# Patient Record
Sex: Male | Born: 1965 | Race: White | Hispanic: No | Marital: Married | Smoking: Never smoker
Health system: Southern US, Community
[De-identification: ages and names within clinical notes are randomized; demographics above are authoritative.]

## PROBLEM LIST (undated history)

## (undated) DIAGNOSIS — I10 Essential (primary) hypertension: Secondary | ICD-10-CM

## (undated) DIAGNOSIS — E7800 Pure hypercholesterolemia, unspecified: Secondary | ICD-10-CM

## (undated) DIAGNOSIS — N529 Male erectile dysfunction, unspecified: Secondary | ICD-10-CM

## (undated) HISTORY — PX: APPENDECTOMY: SHX54

## (undated) HISTORY — PX: FOOT SURGERY: SHX648

## (undated) HISTORY — PX: ACHILLES TENDON REPAIR: SUR1153

---

## 2019-08-18 ENCOUNTER — Inpatient Hospital Stay (HOSPITAL_COMMUNITY)
Admission: AD | Admit: 2019-08-18 | Discharge: 2019-08-20 | DRG: 101 | Disposition: A | Discharge status: Home / Self Care | Payer: 59 | Attending: Internal Medicine | Admitting: Internal Medicine

## 2019-08-18 ENCOUNTER — Other Ambulatory Visit: Payer: Self-pay

## 2019-08-18 ENCOUNTER — Emergency Department (HOSPITAL_COMMUNITY): Payer: 59

## 2019-08-18 ENCOUNTER — Inpatient Hospital Stay (HOSPITAL_COMMUNITY): Payer: 59

## 2019-08-18 ENCOUNTER — Encounter (HOSPITAL_COMMUNITY): Payer: Self-pay

## 2019-08-18 DIAGNOSIS — I6389 Other cerebral infarction: Secondary | ICD-10-CM

## 2019-08-18 DIAGNOSIS — I639 Cerebral infarction, unspecified: Secondary | ICD-10-CM | POA: Diagnosis present

## 2019-08-18 DIAGNOSIS — Z20822 Contact with and (suspected) exposure to covid-19: Secondary | ICD-10-CM | POA: Diagnosis present

## 2019-08-18 DIAGNOSIS — Z7289 Other problems related to lifestyle: Secondary | ICD-10-CM

## 2019-08-18 DIAGNOSIS — R29701 NIHSS score 1: Secondary | ICD-10-CM | POA: Diagnosis present

## 2019-08-18 DIAGNOSIS — G40209 Localization-related (focal) (partial) symptomatic epilepsy and epileptic syndromes with complex partial seizures, not intractable, without status epilepticus: Secondary | ICD-10-CM | POA: Diagnosis present

## 2019-08-18 DIAGNOSIS — N529 Male erectile dysfunction, unspecified: Secondary | ICD-10-CM | POA: Diagnosis present

## 2019-08-18 DIAGNOSIS — F17210 Nicotine dependence, cigarettes, uncomplicated: Secondary | ICD-10-CM | POA: Diagnosis present

## 2019-08-18 DIAGNOSIS — G43909 Migraine, unspecified, not intractable, without status migrainosus: Secondary | ICD-10-CM | POA: Diagnosis present

## 2019-08-18 DIAGNOSIS — Z79899 Other long term (current) drug therapy: Secondary | ICD-10-CM | POA: Diagnosis not present

## 2019-08-18 DIAGNOSIS — R471 Dysarthria and anarthria: Secondary | ICD-10-CM | POA: Diagnosis present

## 2019-08-18 DIAGNOSIS — K219 Gastro-esophageal reflux disease without esophagitis: Secondary | ICD-10-CM | POA: Diagnosis present

## 2019-08-18 DIAGNOSIS — E7800 Pure hypercholesterolemia, unspecified: Secondary | ICD-10-CM | POA: Diagnosis present

## 2019-08-18 DIAGNOSIS — Z716 Tobacco abuse counseling: Secondary | ICD-10-CM | POA: Diagnosis not present

## 2019-08-18 DIAGNOSIS — I10 Essential (primary) hypertension: Secondary | ICD-10-CM | POA: Diagnosis present

## 2019-08-18 DIAGNOSIS — E538 Deficiency of other specified B group vitamins: Secondary | ICD-10-CM | POA: Diagnosis present

## 2019-08-18 DIAGNOSIS — Z720 Tobacco use: Secondary | ICD-10-CM | POA: Diagnosis not present

## 2019-08-18 DIAGNOSIS — R569 Unspecified convulsions: Secondary | ICD-10-CM | POA: Diagnosis not present

## 2019-08-18 DIAGNOSIS — R2681 Unsteadiness on feet: Secondary | ICD-10-CM

## 2019-08-18 DIAGNOSIS — E785 Hyperlipidemia, unspecified: Secondary | ICD-10-CM | POA: Diagnosis present

## 2019-08-18 DIAGNOSIS — G43809 Other migraine, not intractable, without status migrainosus: Secondary | ICD-10-CM | POA: Diagnosis not present

## 2019-08-18 HISTORY — DX: Male erectile dysfunction, unspecified: N52.9

## 2019-08-18 HISTORY — DX: Essential (primary) hypertension: I10

## 2019-08-18 HISTORY — DX: Pure hypercholesterolemia, unspecified: E78.00

## 2019-08-18 LAB — DIFFERENTIAL
Abs Immature Granulocytes: 0.04 10*3/uL (ref 0.00–0.07)
Basophils Absolute: 0.1 10*3/uL (ref 0.0–0.1)
Basophils Relative: 1 %
Eosinophils Absolute: 0.1 10*3/uL (ref 0.0–0.5)
Eosinophils Relative: 2 %
Immature Granulocytes: 1 %
Lymphocytes Relative: 18 %
Lymphs Abs: 1.2 10*3/uL (ref 0.7–4.0)
Monocytes Absolute: 0.7 10*3/uL (ref 0.1–1.0)
Monocytes Relative: 11 %
Neutro Abs: 4.5 10*3/uL (ref 1.7–7.7)
Neutrophils Relative %: 67 %

## 2019-08-18 LAB — URINALYSIS, ROUTINE W REFLEX MICROSCOPIC
Bilirubin Urine: NEGATIVE
Glucose, UA: NEGATIVE mg/dL
Hgb urine dipstick: NEGATIVE
Ketones, ur: NEGATIVE mg/dL
Leukocytes,Ua: NEGATIVE
Nitrite: NEGATIVE
Protein, ur: NEGATIVE mg/dL
Specific Gravity, Urine: 1.015 (ref 1.005–1.030)
pH: 6 (ref 5.0–8.0)

## 2019-08-18 LAB — RAPID URINE DRUG SCREEN, HOSP PERFORMED
Amphetamines: NOT DETECTED
Barbiturates: NOT DETECTED
Benzodiazepines: NOT DETECTED
Cocaine: NOT DETECTED
Opiates: NOT DETECTED
Tetrahydrocannabinol: NOT DETECTED

## 2019-08-18 LAB — COMPREHENSIVE METABOLIC PANEL
ALT: 25 U/L (ref 0–44)
AST: 19 U/L (ref 15–41)
Albumin: 3.9 g/dL (ref 3.5–5.0)
Alkaline Phosphatase: 102 U/L (ref 38–126)
Anion gap: 9 (ref 5–15)
BUN: 11 mg/dL (ref 6–20)
CO2: 23 mmol/L (ref 22–32)
Calcium: 8.6 mg/dL — ABNORMAL LOW (ref 8.9–10.3)
Chloride: 105 mmol/L (ref 98–111)
Creatinine, Ser: 0.66 mg/dL (ref 0.61–1.24)
GFR calc Af Amer: >60 mL/min (ref >60)
GFR calc non Af Amer: >60 mL/min (ref >60)
Glucose, Bld: 107 mg/dL — ABNORMAL HIGH (ref 70–99)
Potassium: 3.9 mmol/L (ref 3.5–5.1)
Sodium: 137 mmol/L (ref 135–145)
Total Bilirubin: 0.4 mg/dL (ref 0.3–1.2)
Total Protein: 6.8 g/dL (ref 6.5–8.1)

## 2019-08-18 LAB — CBC
HCT: 45.6 % (ref 39.0–52.0)
Hemoglobin: 15.4 g/dL (ref 13.0–17.0)
MCH: 30.6 pg (ref 26.0–34.0)
MCHC: 33.8 g/dL (ref 30.0–36.0)
MCV: 90.5 fL (ref 80.0–100.0)
Platelets: 188 10*3/uL (ref 150–400)
RBC: 5.04 MIL/uL (ref 4.22–5.81)
RDW: 12.8 % (ref 11.5–15.5)
WBC: 6.7 10*3/uL (ref 4.0–10.5)
nRBC: 0 % (ref 0.0–0.2)

## 2019-08-18 LAB — AMMONIA: Ammonia: 36 umol/L — ABNORMAL HIGH (ref 9–35)

## 2019-08-18 LAB — ECHOCARDIOGRAM COMPLETE
Height: 72 in
Weight: 3632 oz

## 2019-08-18 LAB — GLUCOSE, CAPILLARY: Glucose-Capillary: 89 mg/dL (ref 70–99)

## 2019-08-18 LAB — TSH: TSH: 0.604 u[IU]/mL (ref 0.350–4.500)

## 2019-08-18 LAB — PROTIME-INR
INR: 1 (ref 0.8–1.2)
Prothrombin Time: 12.8 seconds (ref 11.4–15.2)

## 2019-08-18 LAB — VITAMIN B12: Vitamin B-12: 153 pg/mL — ABNORMAL LOW (ref 180–914)

## 2019-08-18 LAB — APTT: aPTT: 29 seconds (ref 24–36)

## 2019-08-18 LAB — HIV ANTIBODY (ROUTINE TESTING W REFLEX): HIV Screen 4th Generation wRfx: NONREACTIVE

## 2019-08-18 LAB — CBG MONITORING, ED: Glucose-Capillary: 98 mg/dL (ref 70–99)

## 2019-08-18 LAB — SARS CORONAVIRUS 2 BY RT PCR (HOSPITAL ORDER, PERFORMED IN ~~LOC~~ HOSPITAL LAB): SARS Coronavirus 2: NEGATIVE

## 2019-08-18 IMAGING — CT CT HEAD W/O CM
3 series · 16 of 47 positions shown, 19 images · non-contrast
Comparison: None

CLINICAL DATA: Headache, normal neuro exam

EXAM:
CT HEAD WITHOUT CONTRAST
TECHNIQUE: Contiguous axial images were obtained from the base of the skull
through the vertex without intravenous contrast.

[Series 2: head w o · axial · 0.45mm/px · z∈[+1442,+1577]mm · 10 of 33 slices shown, 13 images]
[im 3/33  brain]
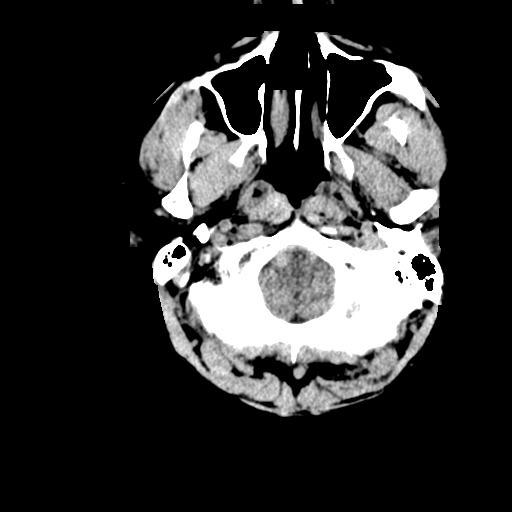
[im 3/33  bone]
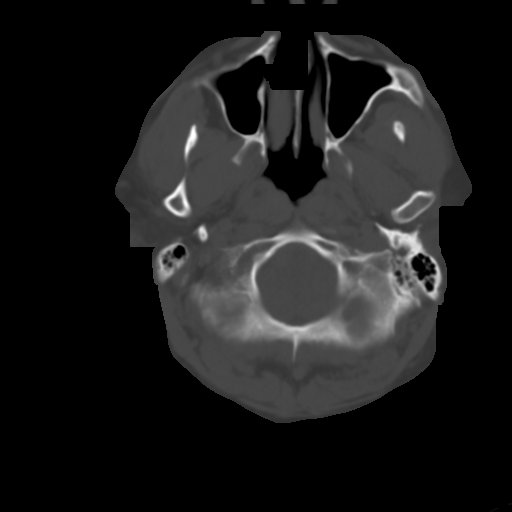
[im 6/33  brain]
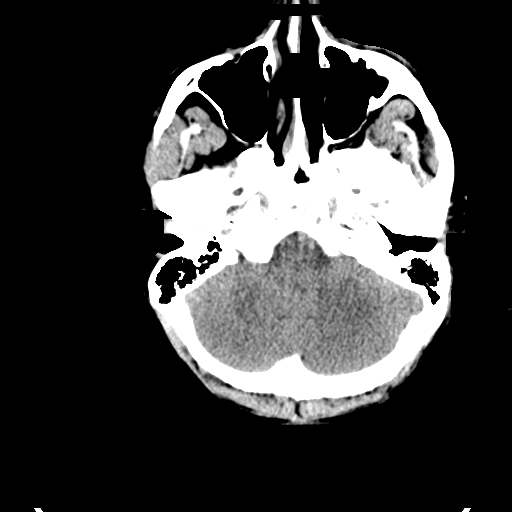
[im 9/33  brain]
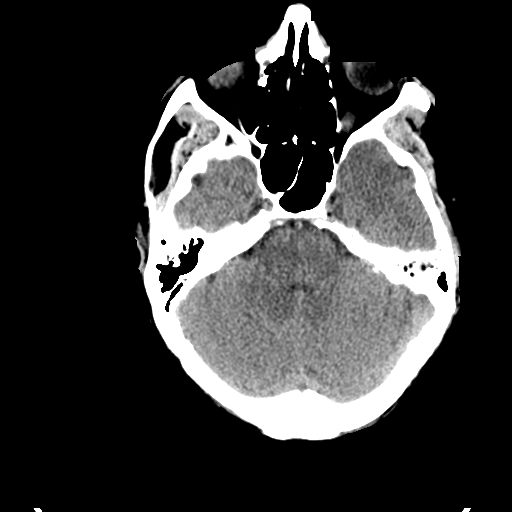
[im 12/33  brain]
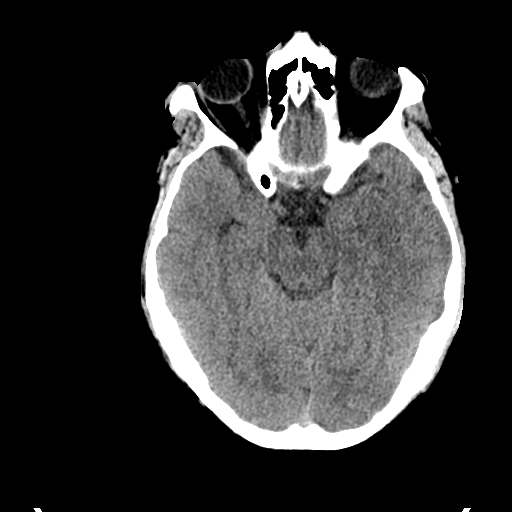
[im 15/33  brain]
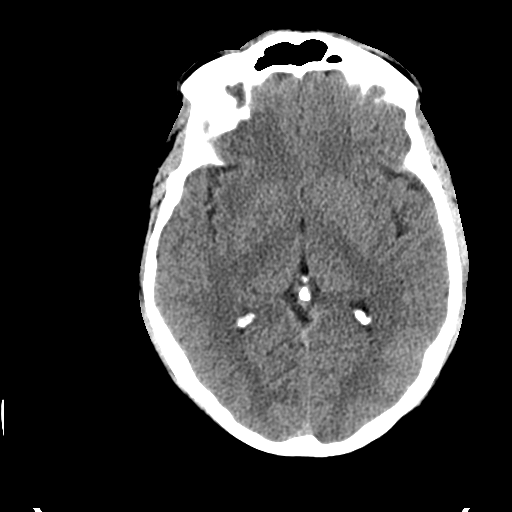
[im 15/33  bone]
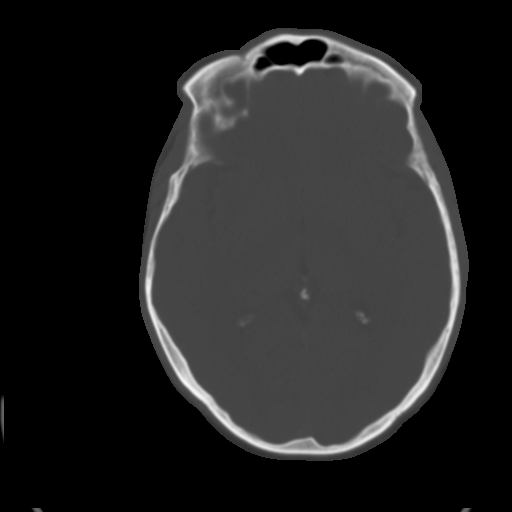
[im 18/33  brain]
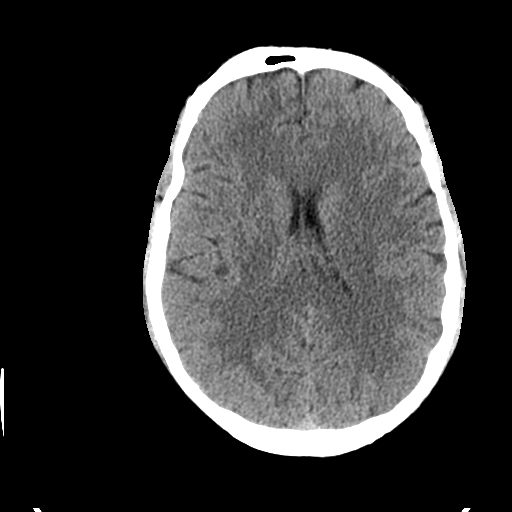
[im 21/33  brain]
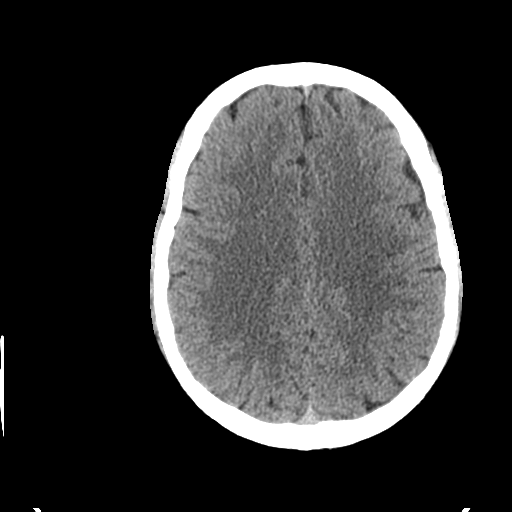
[im 25/33  brain]
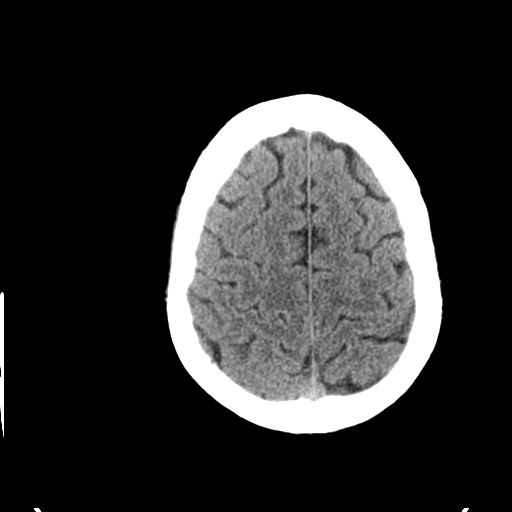
[im 27/33  brain]
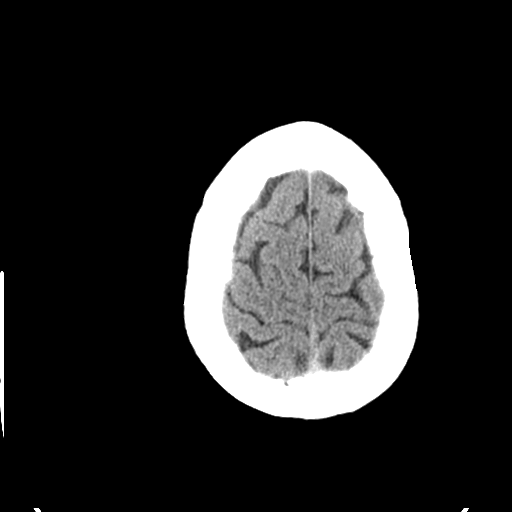
[im 27/33  bone]
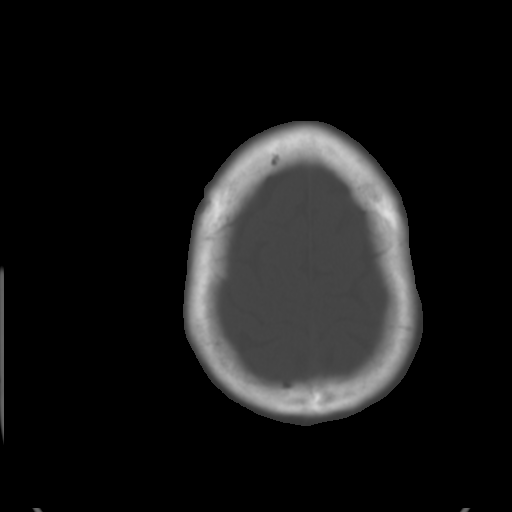
[im 30/33  brain]
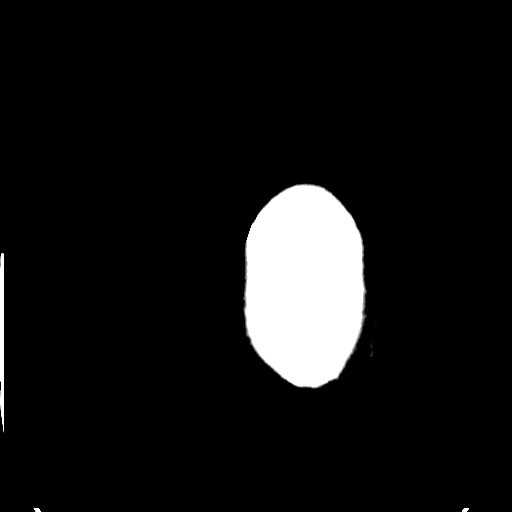

[Series 4: coronal soft · coronal · 0.32mm/px · 3 of 80 slices shown]
[im 27/80  brain]
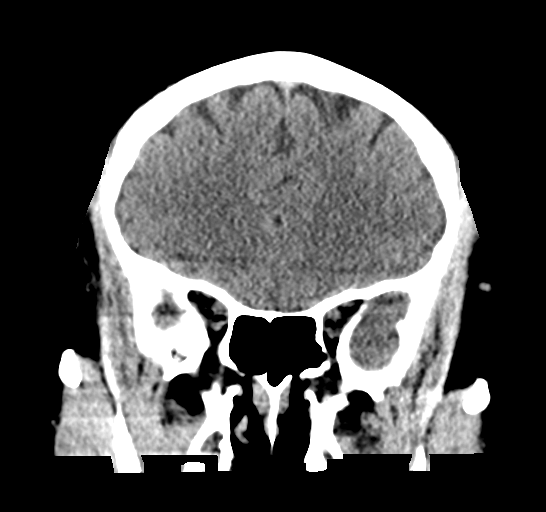
[im 36/80  brain]
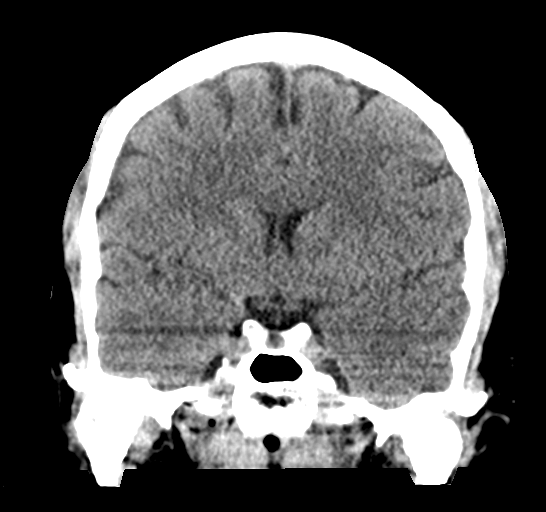
[im 44/80  brain]
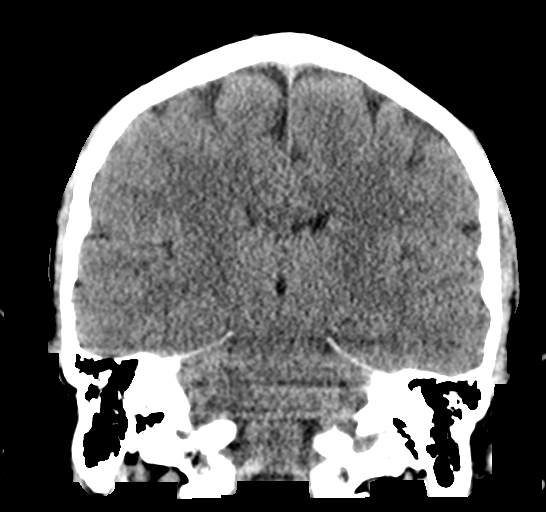

[Series 5: sagittal soft · sagittal · 0.36mm/px · 3 of 57 slices shown]
[im 19/57  brain]
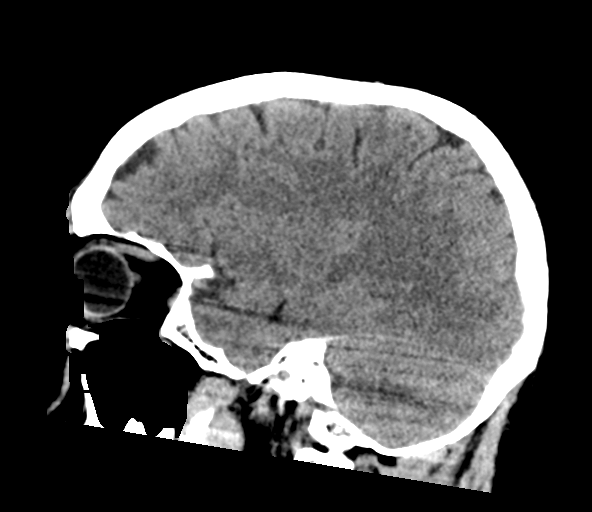
[im 29/57  brain]
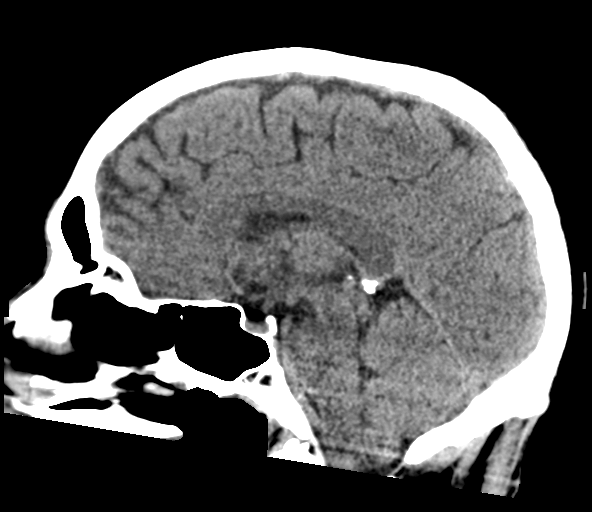
[im 38/57  brain]
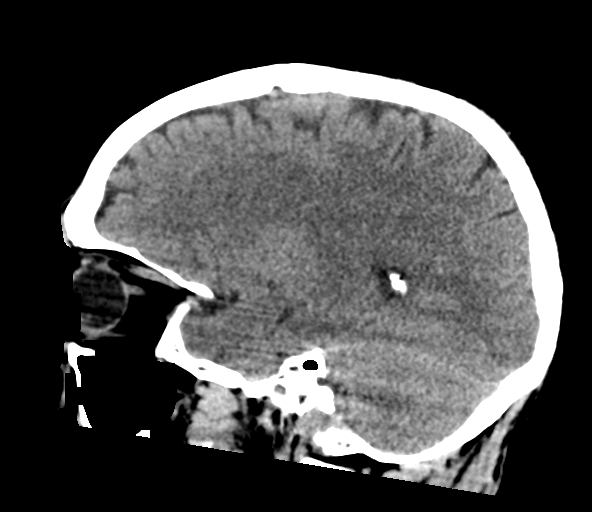

[16 of 47 positions shown; findings below may reference images not displayed]

FINDINGS: Brain: No evidence of acute infarction, hemorrhage, hydrocephalus,
extra-axial collection or mass lesion/mass effect.

Vascular: No hyperdense vessel or unexpected calcification.

Skull: Normal. Negative for fracture or focal lesion.

Sinuses/Orbits: No acute finding.

Other: None.
IMPRESSION: No acute intracranial pathology.

## 2019-08-18 MED ORDER — PANTOPRAZOLE SODIUM 40 MG PO TBEC
40.0000 mg | DELAYED_RELEASE_TABLET | Freq: Every day | ORAL | Status: DC
  Administered 2019-08-19 – 2019-08-20 (×2): 40 mg via ORAL
  Filled 2019-08-18 (×2): qty 1

## 2019-08-18 MED ORDER — ACETAMINOPHEN 160 MG/5ML PO SOLN: 650.0000 mg | ORAL | Status: DC | PRN

## 2019-08-18 MED ORDER — SENNOSIDES-DOCUSATE SODIUM 8.6-50 MG PO TABS
1.0000 | ORAL_TABLET | Freq: Every evening | ORAL | Status: DC | PRN
  Filled 2019-08-18: qty 1

## 2019-08-18 MED ORDER — ASPIRIN 81 MG PO CHEW
81.0000 mg | CHEWABLE_TABLET | Freq: Every day | ORAL | Status: DC
  Administered 2019-08-19 – 2019-08-20 (×2): 81 mg via ORAL
  Filled 2019-08-18 (×2): qty 1

## 2019-08-18 MED ORDER — ACETAMINOPHEN 650 MG RE SUPP: 650.0000 mg | RECTAL | Status: DC | PRN

## 2019-08-18 MED ORDER — NICOTINE 7 MG/24HR TD PT24
7.0000 mg | MEDICATED_PATCH | Freq: Every day | TRANSDERMAL | Status: DC
  Administered 2019-08-18 – 2019-08-20 (×3): 7 mg via TRANSDERMAL
  Filled 2019-08-18 (×5): qty 1

## 2019-08-18 MED ORDER — ACETAMINOPHEN 325 MG PO TABS
650.0000 mg | ORAL_TABLET | ORAL | Status: DC | PRN
  Administered 2019-08-18: 650 mg via ORAL
  Filled 2019-08-18: qty 2

## 2019-08-18 MED ORDER — ASPIRIN 81 MG PO CHEW
324.0000 mg | CHEWABLE_TABLET | Freq: Once | ORAL | Status: AC
  Administered 2019-08-18: 324 mg via ORAL
  Filled 2019-08-18: qty 4

## 2019-08-18 MED ORDER — BUTALBITAL-APAP-CAFFEINE 50-325-40 MG PO TABS
1.0000 | ORAL_TABLET | ORAL | Status: DC | PRN
  Administered 2019-08-18 – 2019-08-19 (×4): 1 via ORAL
  Filled 2019-08-18 (×4): qty 1

## 2019-08-18 MED ORDER — KETOROLAC TROMETHAMINE 30 MG/ML IJ SOLN
15.0000 mg | Freq: Once | INTRAMUSCULAR | Status: AC
  Administered 2019-08-18: 15 mg via INTRAVENOUS
  Filled 2019-08-18: qty 1

## 2019-08-18 MED ORDER — SIMVASTATIN 20 MG PO TABS
40.0000 mg | ORAL_TABLET | Freq: Every day | ORAL | Status: DC
  Administered 2019-08-18 – 2019-08-19 (×2): 40 mg via ORAL
  Filled 2019-08-18 (×2): qty 2

## 2019-08-18 MED ORDER — METOCLOPRAMIDE HCL 5 MG/ML IJ SOLN
10.0000 mg | Freq: Once | INTRAMUSCULAR | Status: DC
  Filled 2019-08-18: qty 2

## 2019-08-18 MED ORDER — LABETALOL HCL 5 MG/ML IV SOLN: 10.0000 mg | INTRAVENOUS | Status: DC | PRN

## 2019-08-18 MED ORDER — ENOXAPARIN SODIUM 40 MG/0.4ML ~~LOC~~ SOLN
40.0000 mg | SUBCUTANEOUS | Status: DC
  Administered 2019-08-18 – 2019-08-19 (×2): 40 mg via SUBCUTANEOUS
  Filled 2019-08-18 (×2): qty 0.4

## 2019-08-18 MED ORDER — SODIUM CHLORIDE 0.9 % IV SOLN: INTRAVENOUS | Status: AC

## 2019-08-18 MED ORDER — CYANOCOBALAMIN 1000 MCG/ML IJ SOLN
1000.0000 ug | Freq: Once | INTRAMUSCULAR | Status: AC
  Administered 2019-08-18: 1000 ug via INTRAMUSCULAR
  Filled 2019-08-18: qty 1

## 2019-08-18 MED ORDER — STROKE: EARLY STAGES OF RECOVERY BOOK
Freq: Once | Status: AC
  Filled 2019-08-18: qty 1

## 2019-08-18 NOTE — Plan of Care (Signed)
Passed [employer] screen

## 2019-08-18 NOTE — Progress Notes (Signed)
*  PRELIMINARY RESULTS* Echocardiogram 2D Echocardiogram has been performed.  Derek Lang 08/18/2019, 1:00 PM

## 2019-08-18 NOTE — Consult Note (Signed)
[facility] Tele[facility]rology Consult Services  Stat Consult  Date of Service:   08/18/2019 08:56:18  Impression:     .  R36.81 - Slurred speech  Comments/Sign-Out: 54 year old man with past medical history of hypertension, hypercholesterolemia, tobacco use and fq etoh use (last use on Sat) who was last known normal on Saturday when he developed a frontal headache extending to the occiput bilaterally. r/o cerebellar/brainstem stroke (lacunar/small vessel disease related). Other causes of slurred speech can include intoxication and metabolic/infectious enceph. Orthostatic hypotension component also plausible.  CT HEAD: Showed No Acute Hemorrhage or Acute Core Infarct Reviewed  Metrics: [facility] Notification Time: 08/18/2019 08:53:12 Stamp Time: 08/18/2019 08:56:18 Callback Response Time: 08/18/2019 08:58:35  Our recommendations are outlined below.  Recommendations:     .  Initiate Aspirin 325 MG Daily     .  asa 325mg  x 1 followed by 81mg  recommended     .  check utox   Imaging Studies:     .  MRI Head Without Contrast     .  MRA Head and Neck Without Contrast When Available - Stroke Protocol     .  Echocardiogram - Transthoracic Echocardiogram  Therapies:     .  Physical Therapy, Occupational Therapy, Speech Therapy Assessment When Applicable  Other WorkUp:     .  Infectious/metabolic workup per primary team     .  Check CMP     .  Check an ammonia level     .  Check B12 level     .  Check TSH     .  Check Urinalysis  Disposition: Neurology Follow Up Recommended  Sign Out:     .  Discussed with Emergency Department Provider  ----------------------------------------------------------------------------------------------------  Chief Complaint: slurred speech  History of Present Illness: Patient is a 54 year old Male.  54 year old man with past medical history of hypertension, hypercholesterolemia, tobacco use and fq etoh use (last use on  Sat) who was last known normal on Saturday when he developed a frontal headache extending to the occiput bilaterally. Since then he has developed slurred speech and dizziness (described as vertigo and lightheadedness) and unsteady gait and intermittent horizontal diplopia. He denies nausea or diff swallowing.    Past Medical History:     . Hypertension     . Hyperlipidemia  Anticoagulant use:  No  Antiplatelet use: No     Examination: BP(126/77), Pulse(59), Blood Glucose(98) 1A: Level of Consciousness - Alert; keenly responsive + 0 1B: Ask Month and Age - Both Questions Right + 0 1C: Blink Eyes & Squeeze Hands - Performs Both Tasks + 0 2: Test Horizontal Extraocular Movements - Normal + 0 3: Test Visual Fields - No Visual Loss + 0 4: Test Facial Palsy (Use Grimace if Obtunded) - Normal symmetry + 0 5A: Test Left Arm Motor Drift - No Drift for 10 Seconds + 0 5B: Test Right Arm Motor Drift - No Drift for 10 Seconds + 0 6A: Test Left Leg Motor Drift - No Drift for 5 Seconds + 0 6B: Test Right Leg Motor Drift - No Drift for 5 Seconds + 0 7: Test Limb Ataxia (FNF/Heel-Shin) - No Ataxia + 0 8: Test Sensation - Normal; No sensory loss + 0 9: Test Language/Aphasia - Normal; No aphasia + 0 10: Test Dysarthria - Mild-Moderate Dysarthria: Slurring but can be understood + 1 11: Test Extinction/Inattention - No abnormality + 0  NIHSS Score: 1   Patient/Family was informed the  Neurology Consult would occur via TeleHealth consult by way of interactive audio and video telecommunications and consented to receiving care in this manner.  Patient is being evaluated for possible acute neurologic impairment and high probability of imminent or life-threatening deterioration. I spent total of 29 minutes providing care to this patient, including time for face to face visit via telemedicine, review of medical records, imaging studies and discussion of findings with providers, the patient and/or  family.   Dr Barron Schmid   [facility] [phone]  Case [id number]

## 2019-08-18 NOTE — ED Triage Notes (Addendum)
EMS reports pt c/o Headache since Saturday.    Denies history of headaches.  Has been taking advil with some relief but has been constant since Saturday.  Last night at 6pm he and his wife noticed he had some slurred speech.  Also says woke up at 2am thinking it was time to get up and was unsteady on his feet.  Went back to bed then got up around 0530 still having slurred speech, headache, and unsteady on his feet.  CBG 93 per ems

## 2019-08-18 NOTE — ED Provider Notes (Signed)
[facility] EMERGENCY DEPARTMENT Provider Note   CSN: [id number] Arrival date & time: 08/18/19  8416     History Chief Complaint  Patient presents with  . Headache    Derek Lang is a 54 y.o. male.  HPI     54 year old with history of hypertension, hyperlipidemia comes in a chief complaint of headache, slurred speech and balance issues.  Patient reports that he started having headache on Saturday.  On Sunday he started noticing that he was having slurred speech and dizziness.  Dizziness is not constant, but present when he gets up and tries to walk.  Symptoms over time improved.  He is also having slurring of the speech without any other focal symptoms such as unilateral numbness, weakness, vision changes.  Patient has no similar history in the past.  His headache is primarily over the vertex and occiput.  The pain has been fairly constant and described as 7 out of 10 at its worst and currently at 2 or 3 out of 10.  Pain does respond to over-the-counter medication but does not completely resolve.  Pain is not positional and there is no history of headaches.  Patient smokes about 1 pack/day.  Past Medical History:  Diagnosis Date  . ED (erectile dysfunction)   . Hypercholesterolemia   . Hypertension     There are no problems to display for this patient.   Past Surgical History:  Procedure Laterality Date  . ACHILLES TENDON REPAIR    . APPENDECTOMY    . FOOT SURGERY         No family history on file.  Social History   Tobacco Use  . Smoking status: Never Smoker  . Smokeless tobacco: Never Used  Substance Use Topics  . Alcohol use: Yes    Comment: occ  . Drug use: Never    Home Medications Prior to Admission medications   Not on File    Allergies    Iodine  Review of Systems   Review of Systems  Constitutional: Positive for activity change.  Eyes: Negative for visual disturbance.  Cardiovascular: Negative for chest pain.  Gastrointestinal: Negative  for nausea and vomiting.  Neurological: Positive for dizziness, speech difficulty and headaches.  Hematological: Does not bruise/bleed easily.  All other systems reviewed and are negative.   Physical Exam Updated Vital Signs BP 126/77   Pulse (!) 59   Temp (!) 97.2 F (36.2 C) (Oral)   Resp 19   Ht 6' (1.829 m)   Wt 103 kg   SpO2 98%   BMI 30.79 kg/m   Physical Exam Vitals reviewed.  Constitutional:      Appearance: He is well-developed.  HENT:     Head: Normocephalic and atraumatic.  Eyes:     General: No visual field deficit.    Pupils: Pupils are equal, round, and reactive to light.  Neck:     Vascular: No JVD.  Cardiovascular:     Rate and Rhythm: Normal rate and regular rhythm.  Pulmonary:     Effort: Pulmonary effort is normal. No respiratory distress.     Breath sounds: Normal breath sounds. No wheezing.  Abdominal:     General: Bowel sounds are normal. There is no distension.     Palpations: Abdomen is soft.     Tenderness: There is no abdominal tenderness. There is no guarding or rebound.  Musculoskeletal:     Cervical back: Normal range of motion and neck supple.  Skin:    General: Skin  is warm and dry.  Neurological:     Mental Status: He is alert and oriented to person, place, and time.     GCS: GCS eye subscore is 4. GCS verbal subscore is 5. GCS motor subscore is 6.     Cranial Nerves: Dysarthria present. No facial asymmetry.     Sensory: No sensory deficit.     Coordination: Coordination normal.     Comments: NIHSS - 0 No objective sensory deficits, Motor strength upper and lower extremity 4+ and equal Normal cerebellar exam     ED Results / Procedures / Treatments   Labs (all labs ordered are listed, but only abnormal results are displayed) Labs Reviewed  PROTIME-INR  APTT  CBC  DIFFERENTIAL  COMPREHENSIVE METABOLIC PANEL  CBG MONITORING, ED  I-STAT CHEM 8, ED    EKG EKG Interpretation  Date/Time:  Monday August 18 2019 07:28:13  EDT Ventricular Rate:  60 PR Interval:    QRS Duration: 87 QT Interval:  439 QTC Calculation: 439 R Axis:   48 Text Interpretation: Sinus rhythm Abnormal R-wave progression, early transition No acute changes No significant change since last tracing Confirmed by Varney Biles [phone]) on 08/18/2019 7:43:06 AM   Radiology CT HEAD WO CONTRAST  Result Date: 08/18/2019 CLINICAL DATA:  Headache, normal neuro exam EXAM: CT HEAD WITHOUT CONTRAST TECHNIQUE: Contiguous axial images were obtained from the base of the skull through the vertex without intravenous contrast. COMPARISON:  None FINDINGS: Brain: No evidence of acute infarction, hemorrhage, hydrocephalus, extra-axial collection or mass lesion/mass effect. Vascular: No hyperdense vessel or unexpected calcification. Skull: Normal. Negative for fracture or focal lesion. Sinuses/Orbits: No acute finding. Other: None. IMPRESSION: No acute intracranial pathology. Electronically Signed   By: Zetta Bills M.D.   On: 08/18/2019 08:09    Procedures Procedures (including critical care time)  Medications Ordered in ED Medications - No data to display  ED Course  I have reviewed the triage vital signs and the nursing notes.  Pertinent labs & imaging results that were available during my care of the patient were reviewed by me and considered in my medical decision making (see chart for details).    MDM Rules/Calculators/A&P                          54 year old comes in a chief complaint of headache, slurred speech and dizziness. He has history of hypertension, hyperlipidemia and smokes 1 pack/day.  Initial stroke scale is 1, for dysarthria. Clinical concerns are for subacute stroke.  Brain aneurysm, brain bleed, thrombosis also in the differential given the headache.  CT head does not reveal any acute findings.  We will consult neurology.  Final Clinical Impression(s) / ED Diagnoses Final diagnoses:  None    Rx / DC Orders ED Discharge  Orders    None       Varney Biles, MD 08/18/19 [phone]

## 2019-08-18 NOTE — Progress Notes (Signed)
Patients family member yelled into the hallway stating help we need help in here. Nurse went to patients room. Upon entering patients room. Patient was lying on right side. Patient had rapid eye movement. Patient slurring speech. Vitals obtained B/P 138/95 pulse 63 O2 98 on room air. Lisa RN obtained CBG reading was 89. Misty Stanley RN and Nurse preformed NIH Assessment patient scored a 1 due to slurred speech. Nurse asked patient does he remember his eyes moving rapidly and patient stated yes he was uncomfortable and turned to his right side. Dr. Sherryll Burger notified. Patient also stated he had a headache rating 5/10. Dr Sherryll Burger placed order for fioricet PRN refer to [facility] for correct dose. Will continue to monitor patient throughout shift.

## 2019-08-18 NOTE — Consult Note (Addendum)
[facility] Derek Lang A. Gerilyn Pilgrim, MD     [email or web address]          Derek Lang is an 54 y.o. male.   ASSESSMENT/PLAN: 1.  Patient has multiple symptoms of unclear etiology.  The symptoms includes dizziness, headaches and dysarthria.  The findings are nonspecific and could represent acute ischemic stroke not seen on CT scan.  Additionally, given his age multiple sclerosis is also concerning.  The patient did have 1 episodic spell of vertigo associated with nystagmus which is concerning for seizures.  I agree with the initial work-up started.  An EEG will be obtained.  Patient has been placed on aspirin.  Patient seems to be all under increased psychosocial stressors related to his father who died about 4 weeks ago and his mother who is ill.  This undoubtedly is also contributing to the patient's symptoms. 2.  Vitamin B12 deficiency which will be replaced.     The patient is a 54 year old white male who presents with symptoms over the last 3 days.  Saturday night he developed low-grade headaches in the posterior area.  It started out as a 2/10 headache and gradually increase overnight 12/10.  He reports that the following day he also developed blurred vision and slurring of his speech.  The symptoms have persisted without any improvement and hence he decided seek medical attention.  It appears that he has significant dizziness and blurring of his vision especially on standing.  The patient had an episode in the hospital earlier today where he developed significant vertiginous symptoms associated with the nurse witnessing the patient having nystagmus.  The patient also had diplopia which resolved.  The episode lasted for couple of minutes or so.  The symptoms are all new over the last 3 days.  There are no reports of fevers, focal numbness or weakness or difficulty swallowing.  He does report having some balance problems associated with his dizziness.  There are no reports of dyspnea,  palpitation, or chest pain.  The patient does not have history of routine episodic headaches.  The review of systems otherwise negative.   GENERAL: The patient is currently doing decently.  He is in no acute distress.  HEENT: Neck is supple no trauma noted.  The patient does have a large tongue and crowded reddened airway.  ABDOMEN: soft  EXTREMITIES: No edema   BACK: Normal  SKIN: Normal by inspection.    MENTAL STATUS: Alert and oriented. Speech -speech shows stuttering, language and cognition are generally intact. Judgment and insight normal.   CRANIAL NERVES: Pupils are equal, round and reactive to light and accomodation; extra ocular movements are full, there is no significant nystagmus; visual fields are full; upper and lower facial muscles are normal in strength and symmetric, there is no flattening of the nasolabial folds; tongue is midline; uvula is midline; shoulder elevation is normal.  MOTOR: Normal tone, bulk and strength; no pronator drift.  COORDINATION: Left finger to nose is normal, right finger to nose is normal, No rest tremor; no intention tremor; no postural tremor; no bradykinesia.  REFLEXES: Deep tendon reflexes are symmetrical and normal. Plantar reflexes are equivocal bilaterally.   SENSATION: Normal to light touch, temperature, and pain.      Blood pressure (!) 138/95, pulse 63, temperature 98.2 F (36.8 C), temperature source Oral, resp. rate 20, height 6' (1.829 m), weight 103 kg, SpO2 98 %.  Past Medical History:  Diagnosis Date  . ED (erectile dysfunction)   . Hypercholesterolemia   .  Hypertension     Past Surgical History:  Procedure Laterality Date  . ACHILLES TENDON REPAIR    . APPENDECTOMY    . FOOT SURGERY      No family history on file.  Social History:  reports that he has never smoked. He has never used smokeless tobacco. He reports current alcohol use. He reports that he does not use drugs.  Allergies:  Allergies  Allergen  Reactions  . Iodine   . Tape     Medications: Prior to Admission medications   Medication Sig Start Date End Date Taking? Authorizing Provider  Cholecalciferol 125 MCG (5000 UT) capsule Take 1 capsule by mouth daily.   Yes [provider]  losartan (COZAAR) 100 MG tablet Take 1 tablet by mouth daily. 05/02/18  Yes [provider]  omeprazole (PRILOSEC) 20 MG capsule Take 20 mg by mouth daily.   Yes [provider]  simvastatin (ZOCOR) 40 MG tablet Take 1 tablet by mouth daily. 05/02/18  Yes [provider]  tadalafil (CIALIS) 20 MG tablet Take 10 mg by mouth daily as needed. 08/04/19  Yes [provider]    Scheduled Meds: . [START ON 08/19/2019] aspirin  81 mg Oral Daily  . enoxaparin (LOVENOX) injection  40 mg Subcutaneous Q24H  . nicotine  7 mg Transdermal Daily  . pantoprazole  40 mg Oral Daily  . simvastatin  40 mg Oral Daily   Continuous Infusions: . sodium chloride 75 mL/hr at 08/18/19 1218   PRN Meds:.acetaminophen **OR** acetaminophen (TYLENOL) oral liquid 160 mg/5 mL **OR** acetaminophen, butalbital-acetaminophen-caffeine, labetalol, senna-docusate     Results for orders placed or performed during the hospital encounter of 08/18/19 (from the past 48 hour(s))  Protime-INR     Status: None   Collection Time: 08/18/19  7:47 AM  Result Value Ref Range   Prothrombin Time 12.8 11.4 - 15.2 seconds   INR 1.0 0.8 - 1.2    Comment: (NOTE) INR goal varies based on device and disease states. Performed at [facility], [address], [place], [place] [address]   APTT     Status: None   Collection Time: 08/18/19  7:47 AM  Result Value Ref Range   aPTT 29 24 - 36 seconds    Comment: Performed at [facility], [address] [place], [place] [address]  CBC     Status: None   Collection Time: 08/18/19  7:47 AM  Result Value Ref Range   WBC 6.7 4.0 - 10.5 K/uL   RBC 5.04 4.22 - 5.81 MIL/uL   Hemoglobin 15.4 13.0 - 17.0 g/dL   HCT  23.5 39 - 52 %   MCV 90.5 80.0 - 100.0 fL   MCH 30.6 26.0 - 34.0 pg   MCHC 33.8 30.0 - 36.0 g/dL   RDW 57.3 22.0 - 25.4 %   Platelets 188 150 - 400 K/uL   nRBC 0.0 0.0 - 0.2 %    Comment: Performed at [facility], [address], [place], [place] [address]  Differential     Status: None   Collection Time: 08/18/19  7:47 AM  Result Value Ref Range   Neutrophils Relative % 67 %   Neutro Abs 4.5 1.7 - 7.7 K/uL   Lymphocytes Relative 18 %   Lymphs Abs 1.2 0.7 - 4.0 K/uL   Monocytes Relative 11 %   Monocytes Absolute 0.7 0 - 1 K/uL   Eosinophils Relative 2 %   Eosinophils Absolute 0.1 0 - 0 K/uL  Basophils Relative 1 %   Basophils Absolute 0.1 0 - 0 K/uL   Immature Granulocytes 1 %   Abs Immature Granulocytes 0.04 0.00 - 0.07 K/uL    Comment: Performed at [facility], [address] [place], [place] [address]  Comprehensive metabolic panel     Status: Abnormal   Collection Time: 08/18/19  7:47 AM  Result Value Ref Range   Sodium 137 135 - 145 mmol/L   Potassium 3.9 3.5 - 5.1 mmol/L   Chloride 105 98 - 111 mmol/L   CO2 23 22 - 32 mmol/L   Glucose, Bld 107 (H) 70 - 99 mg/dL    Comment: Glucose reference range applies only to samples taken after fasting for at least 8 hours.   BUN 11 6 - 20 mg/dL   Creatinine, Ser 6.040.66 0.61 - 1.24 mg/dL   Calcium 8.6 (L) 8.9 - 10.3 mg/dL   Total Protein 6.8 6.5 - 8.1 g/dL   Albumin 3.9 3.5 - 5.0 g/dL   AST 19 15 - 41 U/L   ALT 25 0 - 44 U/L   Alkaline Phosphatase 102 38 - 126 U/L   Total Bilirubin 0.4 0.3 - 1.2 mg/dL   GFR calc non Af Amer >60 >60 mL/min   GFR calc Af Amer >60 >60 mL/min   Anion gap 9 5 - 15    Comment: Performed at [facility], [address] [place], [place] [address]  HIV Antibody (routine testing w rflx)     Status: None   Collection Time: 08/18/19  7:47 AM  Result Value Ref Range   HIV Screen 4th Generation wRfx Non Reactive Non Reactive    Comment: Performed at [facility], [address] [place],  [place] [address]  CBG monitoring, ED     Status: None   Collection Time: 08/18/19  7:59 AM  Result Value Ref Range   Glucose-Capillary 98 70 - 99 mg/dL    Comment: Glucose reference range applies only to samples taken after fasting for at least 8 hours.  Urinalysis, Routine w reflex microscopic     Status: None   Collection Time: 08/18/19 10:44 AM  Result Value Ref Range   Color, Urine YELLOW YELLOW   APPearance CLEAR CLEAR   Specific Gravity, Urine 1.015 1.005 - 1.030   pH 6.0 5.0 - 8.0   Glucose, UA NEGATIVE NEGATIVE mg/dL   Hgb urine dipstick NEGATIVE NEGATIVE   Bilirubin Urine NEGATIVE NEGATIVE   Ketones, ur NEGATIVE NEGATIVE mg/dL   Protein, ur NEGATIVE NEGATIVE mg/dL   Nitrite NEGATIVE NEGATIVE   Leukocytes,Ua NEGATIVE NEGATIVE    Comment: Performed at [facility], [address] [place], [place] [address]  Urine rapid drug screen (hosp performed)     Status: None   Collection Time: 08/18/19 10:45 AM  Result Value Ref Range   Opiates NONE DETECTED NONE DETECTED   Cocaine NONE DETECTED NONE DETECTED   Benzodiazepines NONE DETECTED NONE DETECTED   Amphetamines NONE DETECTED NONE DETECTED   Tetrahydrocannabinol NONE DETECTED NONE DETECTED   Barbiturates NONE DETECTED NONE DETECTED    Comment: (NOTE) DRUG SCREEN FOR MEDICAL PURPOSES ONLY.  IF CONFIRMATION IS NEEDED FOR ANY PURPOSE, NOTIFY LAB WITHIN 5 DAYS.  LOWEST DETECTABLE LIMITS FOR URINE DRUG SCREEN Drug Class                     Cutoff (ng/mL) Amphetamine and metabolites    1000 Barbiturate and metabolites    200 Benzodiazepine  831 Tricyclics and metabolites     300 Opiates and metabolites        300 Cocaine and metabolites        300 THC                            50 Performed at [facility], [address] [place], [place] [address]   Vitamin B12     Status: Abnormal   Collection Time: 08/18/19 10:47 AM  Result Value Ref Range   Vitamin B-12 153 (L) 180 - 914 pg/mL    Comment: (NOTE) This assay  is not validated for testing neonatal or myeloproliferative syndrome specimens for Vitamin B12 levels. Performed at [facility], [address] [place], [place] [address]   TSH     Status: None   Collection Time: 08/18/19 10:47 AM  Result Value Ref Range   TSH 0.604 0.350 - 4.500 uIU/mL    Comment: Performed by a 3rd Generation assay with a functional sensitivity of <=0.01 uIU/mL. Performed at [facility], [address], [place], [place] [address]   Ammonia     Status: Abnormal   Collection Time: 08/18/19 10:47 AM  Result Value Ref Range   Ammonia 36 (H) 9 - 35 umol/L    Comment: Performed at [facility], [address] [place], [place] [address]  SARS Coronavirus 2 by RT PCR (hospital order, performed in [facility] hospital lab) Nasopharyngeal Nasopharyngeal Swab     Status: None   Collection Time: 08/18/19 11:18 AM   Specimen: Nasopharyngeal Swab  Result Value Ref Range   SARS Coronavirus 2 NEGATIVE NEGATIVE    Comment: (NOTE) SARS-CoV-2 target nucleic acids are NOT DETECTED.  The SARS-CoV-2 RNA is generally detectable in upper and lower respiratory specimens during the acute phase of infection. The lowest concentration of SARS-CoV-2 viral copies this assay can detect is 250 copies / mL. A negative result does not preclude SARS-CoV-2 infection and should not be used as the sole basis for treatment or other patient management decisions.  A negative result may occur with improper specimen collection / handling, submission of specimen other than nasopharyngeal swab, presence of viral mutation(s) within the areas targeted by this assay, and inadequate number of viral copies (<250 copies / mL). A negative result must be combined with clinical observations, patient history, and epidemiological information.  Fact Sheet for Patients:   StrictlyIdeas.no  Fact Sheet for Healthcare Providers: BankingDealers.co.za  This test is not yet  approved or  cleared by the [place] FDA and has been authorized for detection and/or diagnosis of SARS-CoV-2 by FDA under an Emergency Use Authorization (EUA).  This EUA will remain in effect (meaning this test can be used) for the duration of the COVID-19 declaration under Section 564(b)(1) of the Act, 21 U.S.C. section 360bbb-3(b)(1), unless the authorization is terminated or revoked sooner.  Performed at [facility], [address] [place], [place] [address]   Glucose, capillary     Status: None   Collection Time: 08/18/19  4:20 PM  Result Value Ref Range   Glucose-Capillary 89 70 - 99 mg/dL    Comment: Glucose reference range applies only to samples taken after fasting for at least 8 hours.    Studies/Results:   HEAD CT FINDINGS: Brain: No evidence of acute infarction, hemorrhage, hydrocephalus, extra-axial collection or mass lesion/mass effect.  Vascular: No hyperdense vessel or unexpected calcification.  Skull: Normal. Negative for fracture or focal lesion.  Sinuses/Orbits: No acute finding.  Other: None.  IMPRESSION: No acute intracranial pathology    The head CT scan is reviewed in person in shows no acute changes. There is no evidence of hypodensity, encephalomalacia or fluid collection. There is calcified choroid plexus.     Evrett Hakim A. Gerilyn Pilgrim, M.D.  Diplomate, Biomedical engineer of Psychiatry and Neurology ( Neurology). 08/18/2019, 5:19 PM

## 2019-08-18 NOTE — H&P (Addendum)
History and Physical    Derek Lang YBO:[id number] DOB: 1965/04/25 DOA: 08/18/2019  PCP: Florene Glen, NP   Patient coming from: Home  Chief Complaint: Headache/speech difficulty  HPI: Derek Lang is a 54 y.o. male with medical history significant for hypertension, dyslipidemia, occasional alcohol use, and ongoing tobacco abuse who presented today with complaints of headache that began 2 days ago on Saturday.  He states that the headache was quite severe and started on the frontal side of his head and radiated towards the back.  He was also noted to have some speech slurring at that time which then continued to worsen into Sunday and was also accompanied with double vision as well as some dizziness and gait difficulties.  He states that he has some worsening dizziness when he lays down and tends to improve with standing up.  His symptoms worsened today which prompted the ED visit.  He denies any weakness or numbness in his arms or legs.  He rates the pain level currently 3/10.  He has tried some over-the-counter ibuprofen and Tylenol with minimal assistance with his pain.  He denies any fever, chills, nausea, vomiting, chest pain, shortness of breath, or abdominal pain.   ED Course: Stable vital signs noted and laboratory data without any acute abnormalities.  CT of the head with no acute findings noted either.  EKG was sinus rhythm at 60 bpm.  He is being given Toradol for his headache.  Telemetry neurology evaluation performed with recommendations for MRI head and MRA head and neck without contrast as well as 2D echocardiogram and neurology evaluation.  Multiple lab recommendations ordered as well and pending.  Review of Systems: All others reviewed and otherwise negative as noted above.   Past Medical History:  Diagnosis Date  . ED (erectile dysfunction)   . Hypercholesterolemia   . Hypertension     Past Surgical History:  Procedure Laterality Date  . ACHILLES TENDON REPAIR    .  APPENDECTOMY    . FOOT SURGERY       reports that he has never smoked. He has never used smokeless tobacco. He reports current alcohol use. He reports that he does not use drugs.  Allergies  Allergen Reactions  . Iodine   . Tape     No family history on file.  Prior to Admission medications   Not on File    Physical Exam: Vitals:   08/18/19 0724 08/18/19 0800 08/18/19 0900 08/18/19 1100  BP: 133/82 126/77 118/72 130/78  Pulse: (!) 59 (!) 59 (!) 56 62  Resp: 16 19 15 16   Temp: (!) 97.2 F (36.2 C)     TempSrc: Oral     SpO2: 100% 98% 100% 99%  Weight:      Height:        Constitutional: NAD, calm, comfortable Vitals:   08/18/19 0724 08/18/19 0800 08/18/19 0900 08/18/19 1100  BP: 133/82 126/77 118/72 130/78  Pulse: (!) 59 (!) 59 (!) 56 62  Resp: 16 19 15 16   Temp: (!) 97.2 F (36.2 C)     TempSrc: Oral     SpO2: 100% 98% 100% 99%  Weight:      Height:       Eyes: lids and conjunctivae normal ENMT: Mucous membranes are moist.  Neck: normal, supple Respiratory: clear to auscultation bilaterally. Normal respiratory effort. No accessory muscle use.  Cardiovascular: Regular rate and rhythm, no murmurs. No extremity edema. Abdomen: no tenderness, no distention. Bowel sounds positive.  Musculoskeletal:  No joint deformity upper and lower extremities.   Skin: no rashes, lesions, ulcers.  Psychiatric: Normal judgment and insight. Alert and oriented x 3. Normal mood.  Neurology: Cranial nerve examination grossly normal and no focal deficits identified on upper or lower extremity exam.  Labs on Admission: I have personally reviewed following labs and imaging studies  CBC: Recent Labs  Lab 08/18/19 0747  WBC 6.7  NEUTROABS 4.5  HGB 15.4  HCT 45.6  MCV 90.5  PLT 188   Basic Metabolic Panel: Recent Labs  Lab 08/18/19 0747  NA 137  K 3.9  CL 105  CO2 23  GLUCOSE 107*  BUN 11  CREATININE 0.66  CALCIUM 8.6*   GFR: Estimated Creatinine Clearance: 134.1  mL/min (by C-G formula based on SCr of 0.66 mg/dL). Liver Function Tests: Recent Labs  Lab 08/18/19 0747  AST 19  ALT 25  ALKPHOS 102  BILITOT 0.4  PROT 6.8  ALBUMIN 3.9   No results for input(s): LIPASE, AMYLASE in the last 168 hours. No results for input(s): AMMONIA in the last 168 hours. Coagulation Profile: Recent Labs  Lab 08/18/19 0747  INR 1.0   Cardiac Enzymes: No results for input(s): CKTOTAL, CKMB, CKMBINDEX, TROPONINI in the last 168 hours. BNP (last 3 results) No results for input(s): PROBNP in the last 8760 hours. HbA1C: No results for input(s): HGBA1C in the last 72 hours. CBG: Recent Labs  Lab 08/18/19 0759  GLUCAP 98   Lipid Profile: No results for input(s): CHOL, HDL, LDLCALC, TRIG, CHOLHDL, LDLDIRECT in the last 72 hours. Thyroid Function Tests: No results for input(s): TSH, T4TOTAL, FREET4, T3FREE, THYROIDAB in the last 72 hours. Anemia Panel: No results for input(s): VITAMINB12, FOLATE, FERRITIN, TIBC, IRON, RETICCTPCT in the last 72 hours. Urine analysis:    Component Value Date/Time   COLORURINE YELLOW 08/18/2019 1044   APPEARANCEUR CLEAR 08/18/2019 1044   LABSPEC 1.015 08/18/2019 1044   PHURINE 6.0 08/18/2019 1044   GLUCOSEU NEGATIVE 08/18/2019 1044   HGBUR NEGATIVE 08/18/2019 1044   BILIRUBINUR NEGATIVE 08/18/2019 1044   KETONESUR NEGATIVE 08/18/2019 1044   PROTEINUR NEGATIVE 08/18/2019 1044   NITRITE NEGATIVE 08/18/2019 1044   LEUKOCYTESUR NEGATIVE 08/18/2019 1044    Radiological Exams on Admission: CT HEAD WO CONTRAST  Result Date: 08/18/2019 CLINICAL DATA:  Headache, normal neuro exam EXAM: CT HEAD WITHOUT CONTRAST TECHNIQUE: Contiguous axial images were obtained from the base of the skull through the vertex without intravenous contrast. COMPARISON:  None FINDINGS: Brain: No evidence of acute infarction, hemorrhage, hydrocephalus, extra-axial collection or mass lesion/mass effect. Vascular: No hyperdense vessel or unexpected  calcification. Skull: Normal. Negative for fracture or focal lesion. Sinuses/Orbits: No acute finding. Other: None. IMPRESSION: No acute intracranial pathology. Electronically Signed   By: Donzetta Kohut M.D.   On: 08/18/2019 08:09    EKG: Independently reviewed. SR 60bpm.  Assessment/Plan Active Problems:   CVA (cerebral vascular accident) (HCC)    Headache with slurred speech -Concern for possible cerebellar/brainstem CVA versus other etiology -Appreciate telemetry neuro recommendations with full dose aspirin given in ED which will be followed by 81 mg daily -Check ammonia level, B12, TSH, urine analysis, and UDS -Check lipid panel and hemoglobin A1c -Maintain on home statin -Brain MRI/MRA head and neck ordered for am. -2D echocardiogram -PT/OT/SLP evaluation -As needed pain medication with Toradol for headache -Fall precautions  Hypertension -Avoid home losartan and allow permissive hypertension -IV labetalol for severe elevations  Dyslipidemia -Continue home statin -Plan to check lipid panel  GERD -PPI daily  Tobacco abuse -Nicotine patch -Counseled on cessation  Occasional alcohol use -Monitor for withdrawal symptoms  DVT prophylaxis: Lovenox Code Status: Full code Family Communication:Wife at bedside  Disposition Plan: CVA evaluation. Consults called:Neurology, spoke with Dr. Merlene Laughter Admission status: Inpatient, Tele Status is: Inpatient  Remains inpatient appropriate because:Ongoing diagnostic testing needed not appropriate for outpatient work up, Unsafe d/c plan and Inpatient level of care appropriate due to severity of illness   Dispo: The patient is from: Home              Anticipated d/c is to: Home              Anticipated d/c date is: 2 days              Patient currently is not medically stable to d/c.  Derek Satz D Manuella Ghazi DO [facility]  If 7PM-7AM, please contact night-coverage [email or web address]  08/18/2019, 11:07 AM

## 2019-08-19 ENCOUNTER — Inpatient Hospital Stay (HOSPITAL_COMMUNITY): Payer: 59

## 2019-08-19 ENCOUNTER — Inpatient Hospital Stay (HOSPITAL_COMMUNITY)
Admit: 2019-08-19 | Discharge: 2019-08-19 | Disposition: A | Discharge status: Home / Self Care | Payer: 59 | Attending: Neurology | Admitting: Neurology

## 2019-08-19 DIAGNOSIS — R569 Unspecified convulsions: Secondary | ICD-10-CM

## 2019-08-19 DIAGNOSIS — G43809 Other migraine, not intractable, without status migrainosus: Secondary | ICD-10-CM

## 2019-08-19 DIAGNOSIS — Z720 Tobacco use: Secondary | ICD-10-CM

## 2019-08-19 DIAGNOSIS — K219 Gastro-esophageal reflux disease without esophagitis: Secondary | ICD-10-CM

## 2019-08-19 DIAGNOSIS — R471 Dysarthria and anarthria: Secondary | ICD-10-CM

## 2019-08-19 DIAGNOSIS — I10 Essential (primary) hypertension: Secondary | ICD-10-CM

## 2019-08-19 DIAGNOSIS — E538 Deficiency of other specified B group vitamins: Secondary | ICD-10-CM

## 2019-08-19 DIAGNOSIS — E785 Hyperlipidemia, unspecified: Secondary | ICD-10-CM

## 2019-08-19 LAB — LIPID PANEL
Cholesterol: 137 mg/dL (ref 0–200)
HDL: 25 mg/dL — ABNORMAL LOW (ref >40)
LDL Cholesterol: 75 mg/dL (ref 0–99)
Total CHOL/HDL Ratio: 5.5 RATIO
Triglycerides: 183 mg/dL — ABNORMAL HIGH (ref <150)
VLDL: 37 mg/dL (ref 0–40)

## 2019-08-19 LAB — HEMOGLOBIN A1C
Hgb A1c MFr Bld: 5.4 % (ref 4.8–5.6)
Mean Plasma Glucose: 108.28 mg/dL

## 2019-08-19 IMAGING — MR MR MRA NECK WO/W CM
3 of 4 series · 21 of 48 positions shown · IV contrast (gadavist)
Comparison: Head CT and brain MRI today.

CLINICAL DATA: 54-year-old male with persistent headache for 3
days. Subacute neurologic deficit(s).

EXAM:
MRA NECK WITHOUT AND WITH CONTRAST
TECHNIQUE: Multiplanar and multiecho pulse sequences of the neck were obtained
without and with intravenous contrast. Angiographic images of the
neck were obtained using MRA technique without and with intravenous
contrast.
CONTRAST:  10mL GADAVIST GADOBUTROL 1 MMOL/ML IV SOLN

[Series 4: tof_3d_multi-slab · axial · 1.0mm · 0.52mm/px · z∈[-290,-200]mm · 11 of 104 slices shown]
[im 7/104]
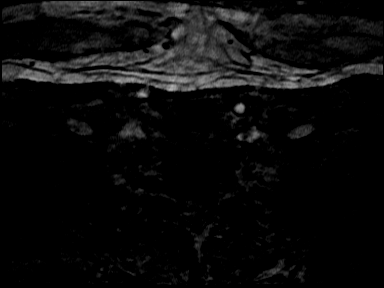
[im 13/104]
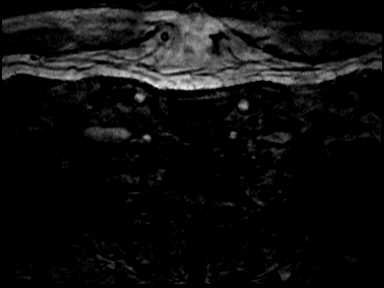
[im 20/104]
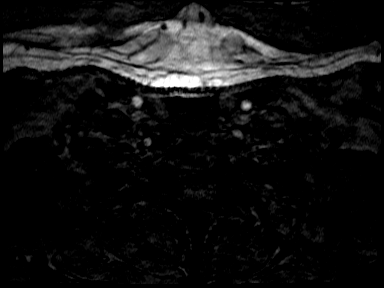
[im 33/104]
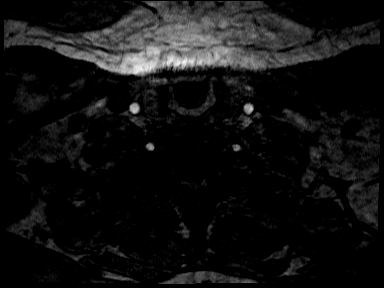
[im 46/104]
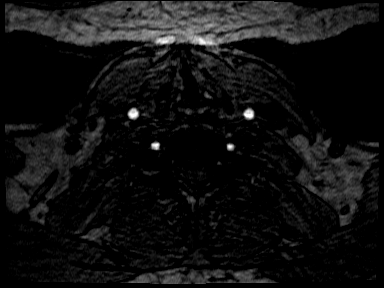
[im 52/104]
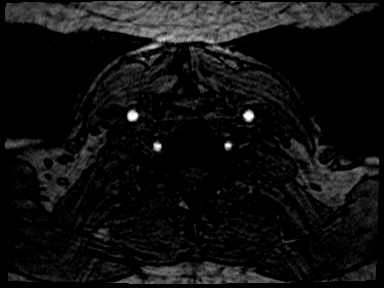
[im 58/104]
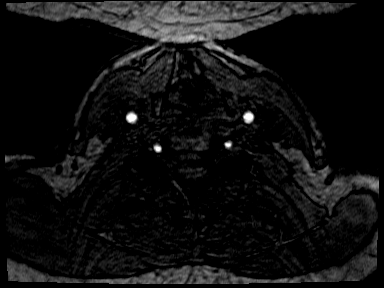
[im 71/104]
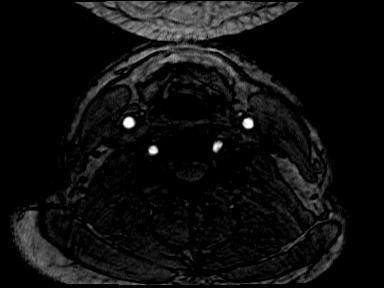
[im 84/104]
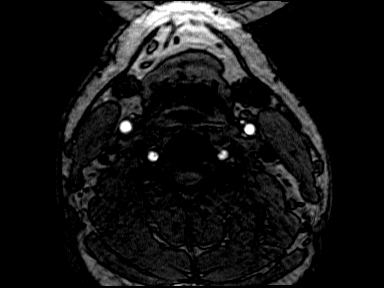
[im 91/104]
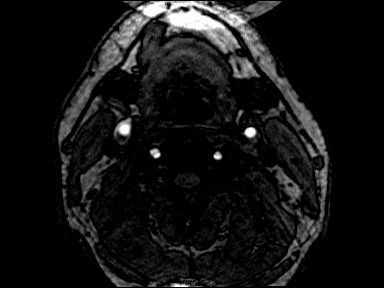
[im 97/104]
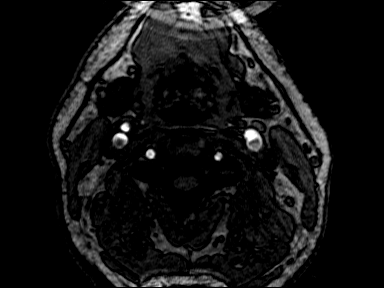

[Series 7: (id)_cor_pre · coronal · 1.2mm · 0.41mm/px · 7 of 80 slices shown]
[im 1/80]
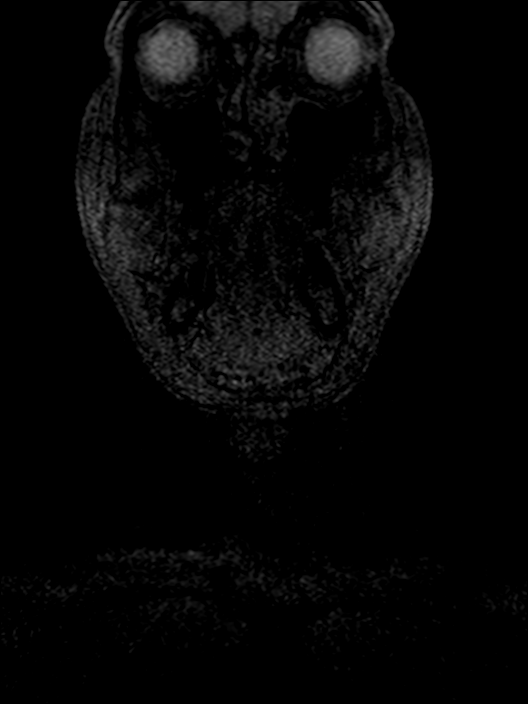
[im 13/80]
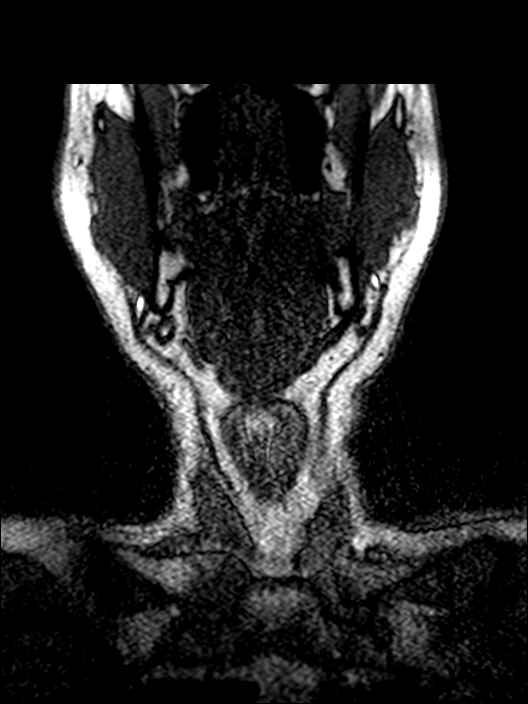
[im 25/80]
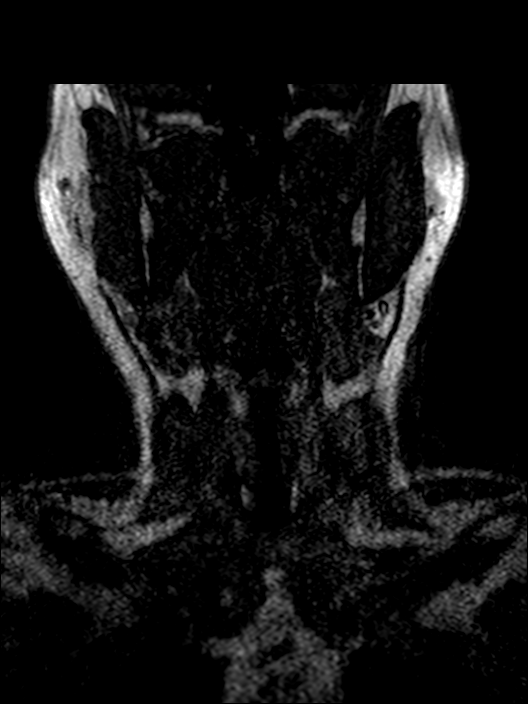
[im 37/80]
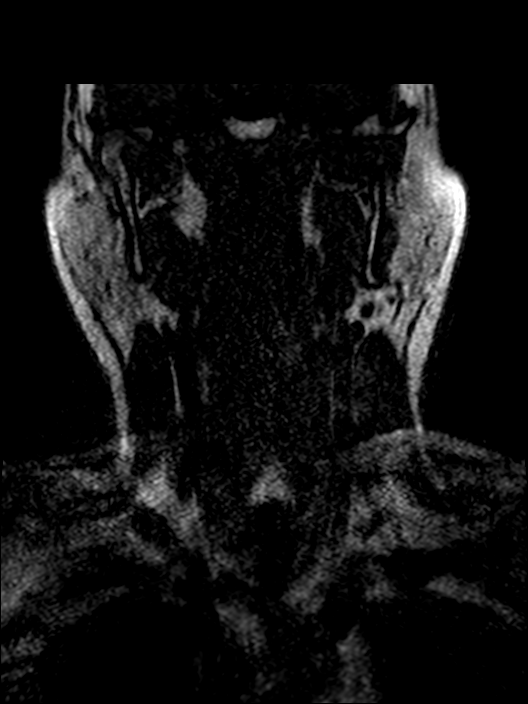
[im 43/80]
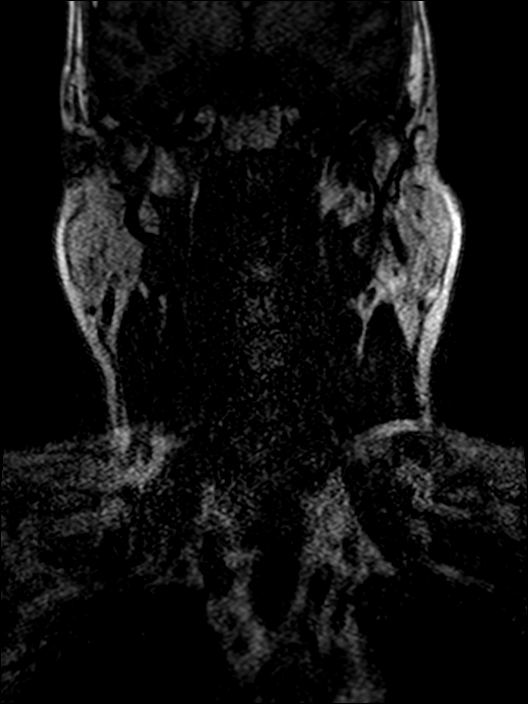
[im 55/80]
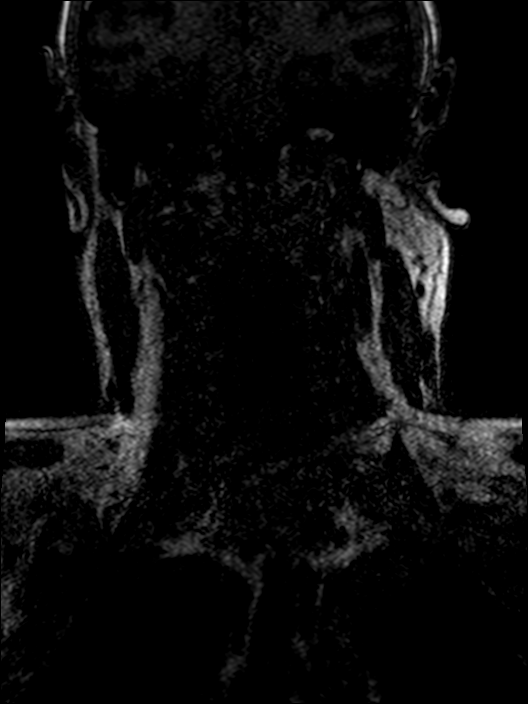
[im 67/80]
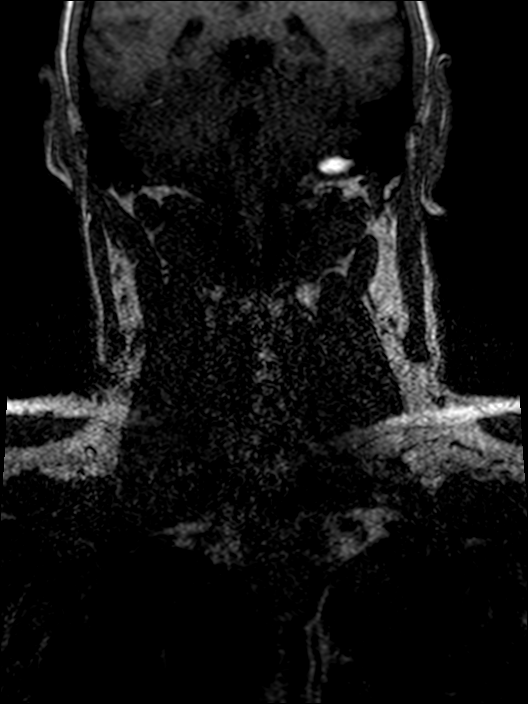

[Series 17: (id)_cor_post · coronal · 1.2mm · 0.41mm/px · 3 of 80 slices shown]
[im 13/80]
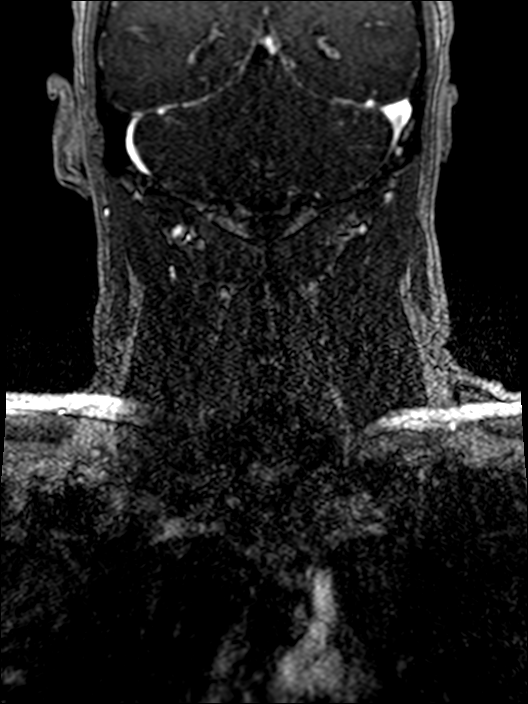
[im 43/80]
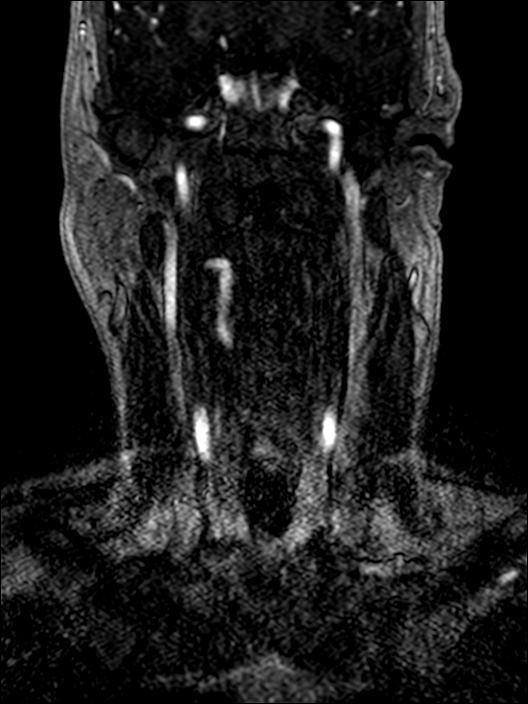
[im 67/80]
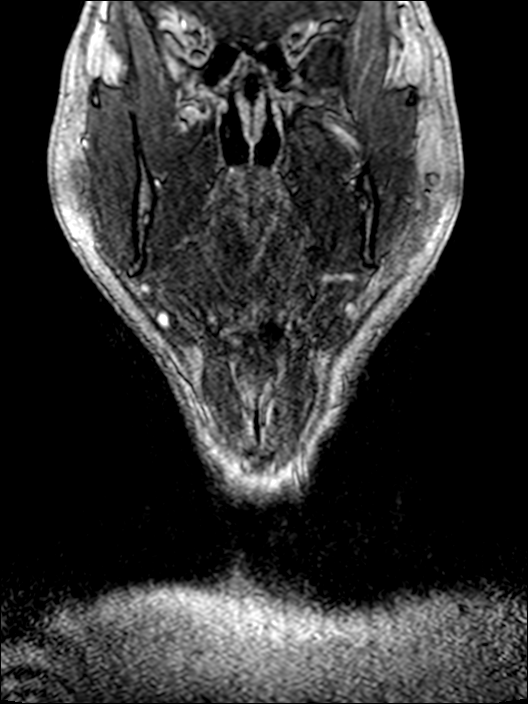

[21 of 48 positions shown; findings below may reference images not displayed]

FINDINGS: Precontrast time-of-flight images demonstrate antegrade flow in both
cervical carotid and vertebral arteries. Vertebral arteries appear
codominant and tortuous. Carotid bifurcations are within normal
limits.

Post-contrast neck MRA images demonstrate a 3 vessel arch
configuration.

The right CCA, right carotid bifurcation and cervical right ICA
appear normal.

The left CCA, left carotid bifurcation and cervical left ICA appear
normal.

Proximal subclavian arteries and vertebral artery origins appear
within normal limits. Codominant vertebral arteries with mild V2
tortuosity (greater on the left) appear normal.

Patent visible anterior and posterior circulation. See also
intracranial MRA reported separately.
IMPRESSION: Normal Neck MRA.

## 2019-08-19 IMAGING — MR MR HEAD W/O CM
8 of 10 series · 34 of 48 positions shown · non-contrast
Comparison: Head CT [DATE].

CLINICAL DATA: 54-year-old male with persistent headache for 3
days. Subacute neurologic deficit(s).

EXAM:
MRI HEAD WITHOUT CONTRAST
TECHNIQUE: Multiplanar, multiecho pulse sequences of the brain and surrounding
structures were obtained without intravenous contrast.

[Series 2: T1 · sagittal · 5.0mm · 0.42mm/px · 3 of 20 slices shown (1 of 2)]
[im 1/20]
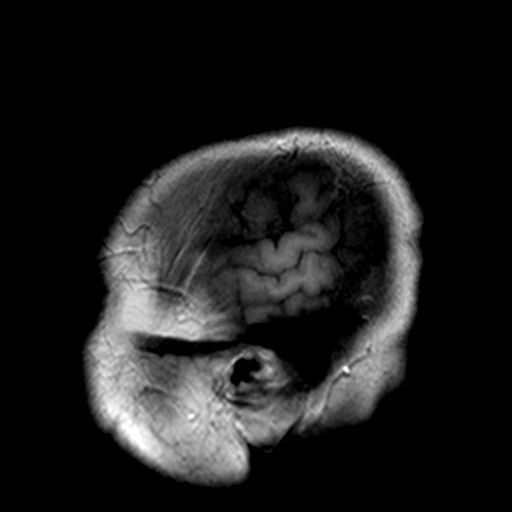
[im 10/20]
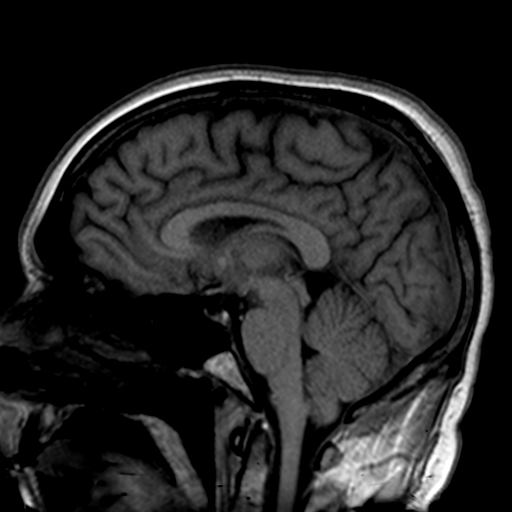
[im 20/20]
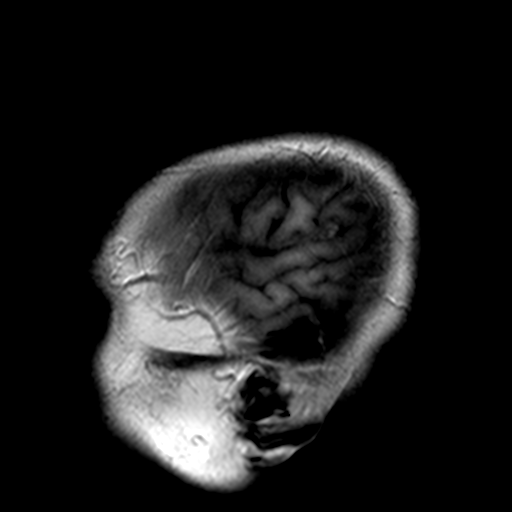

[Series 3: DWI · axial · 3.0mm · 0.82mm/px · z∈[-137,+22]mm · 6 of 54 slices shown (1 of 2)]
[im 1/54]
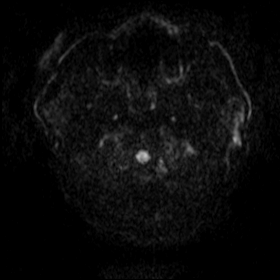
[im 11/54]
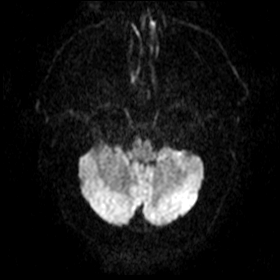
[im 22/54]
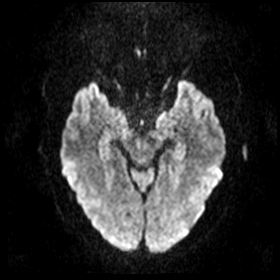
[im 32/54]
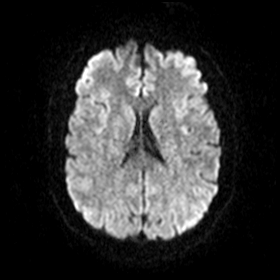
[im 43/54]
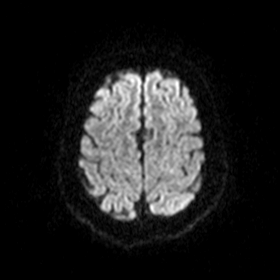
[im 54/54]
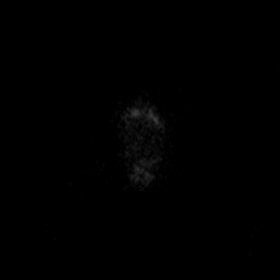

[Series 5: DWI · coronal · 5.0mm · 0.53mm/px · 4 of 34 slices shown (2 of 2)]
[im 1/34]
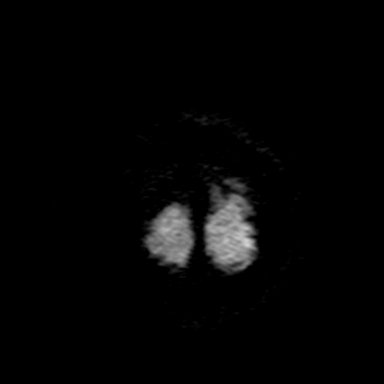
[im 12/34]
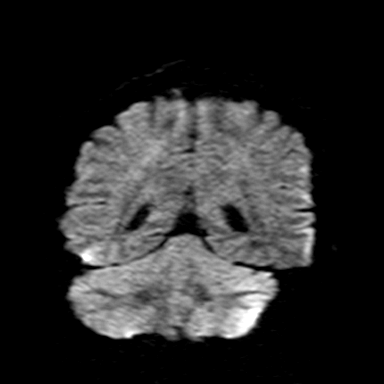
[im 23/34]
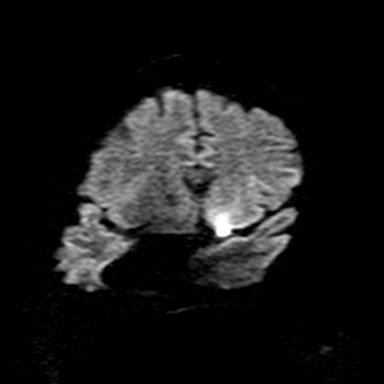
[im 34/34]
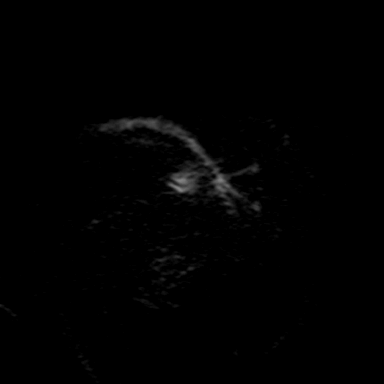

[Series 7: T2 · axial · 5.0mm · 0.75mm/px · z∈[-130,+13]mm · 3 of 23 slices shown (1 of 3)]
[im 1/23]
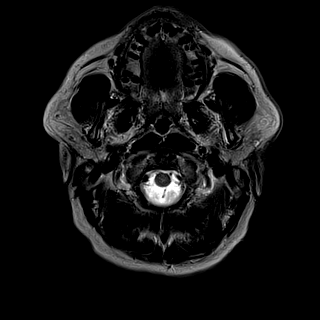
[im 12/23]
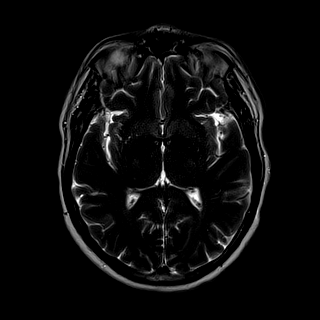
[im 23/23]
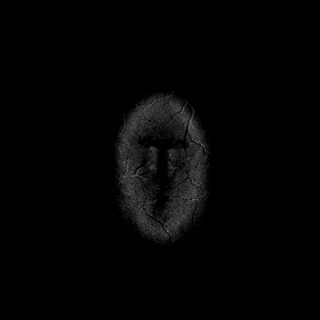

[Series 8: FLAIR · axial · 3.0mm · 0.94mm/px · z∈[-128,+10]mm · 5 of 47 slices shown]
[im 1/47]
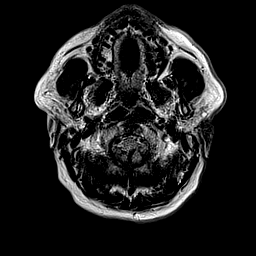
[im 12/47]
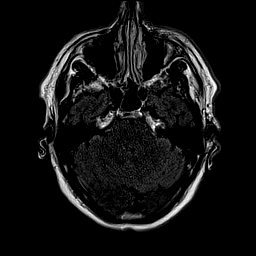
[im 24/47]
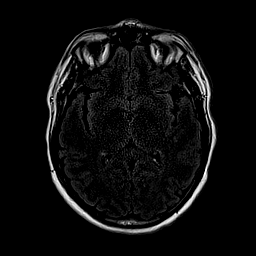
[im 35/47]
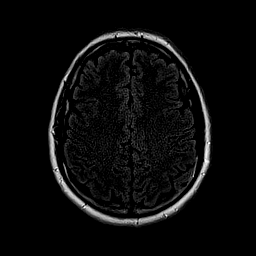
[im 47/47]
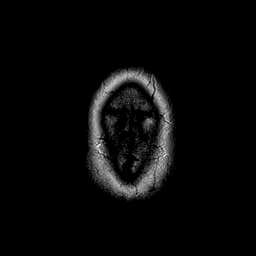

[Series 9: T2 · axial · 5.0mm · 0.45mm/px · z∈[-124,+6]mm · 2 of 21 slices shown (2 of 3)]
[im 1/21]
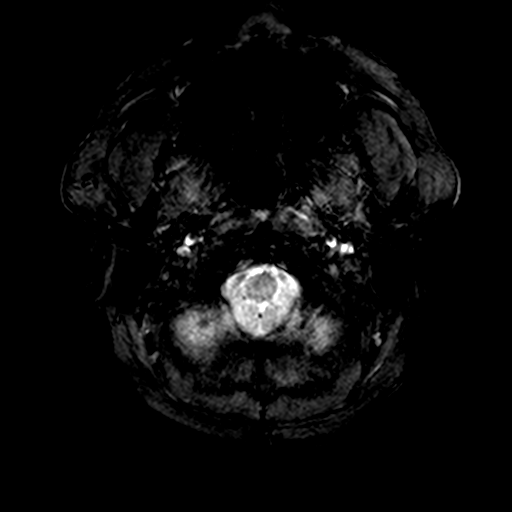
[im 21/21]
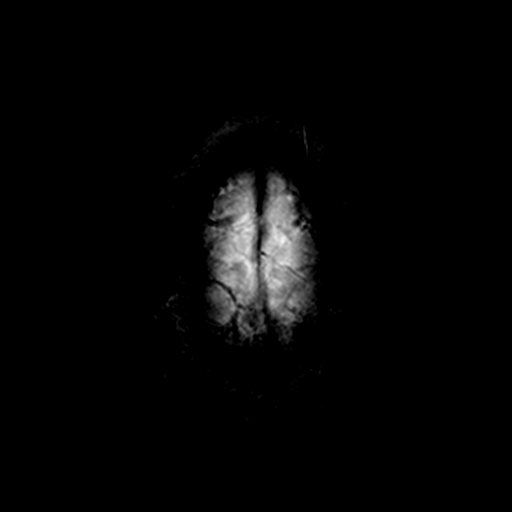

[Series 10: T1 · axial · 2.0mm · 0.47mm/px · z∈[-162,+44]mm · 8 of 104 slices shown (2 of 2)]
[im 1/104]
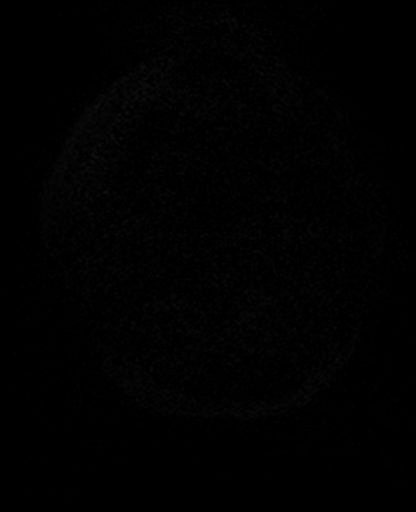
[im 19/104]
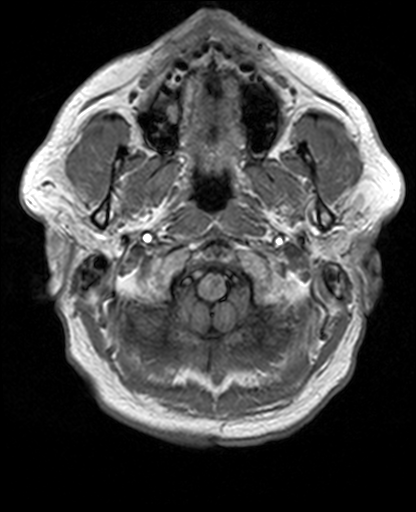
[im 29/104]
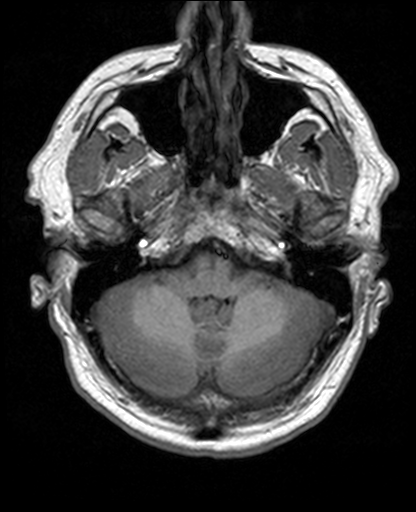
[im 47/104]
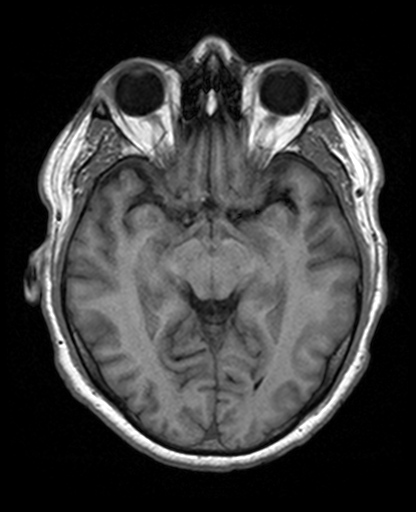
[im 57/104]
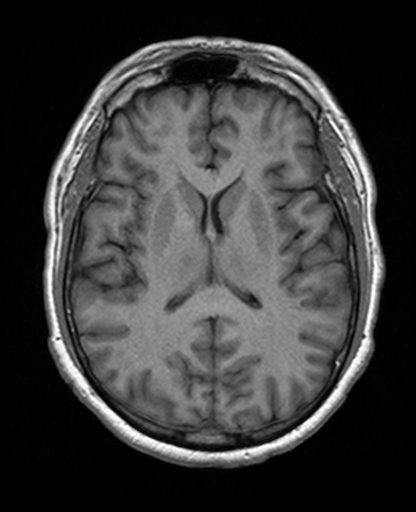
[im 75/104]
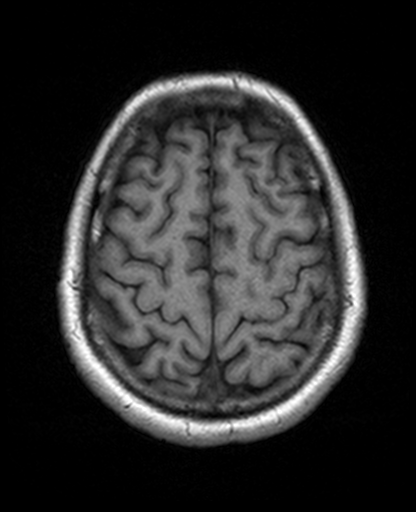
[im 85/104]
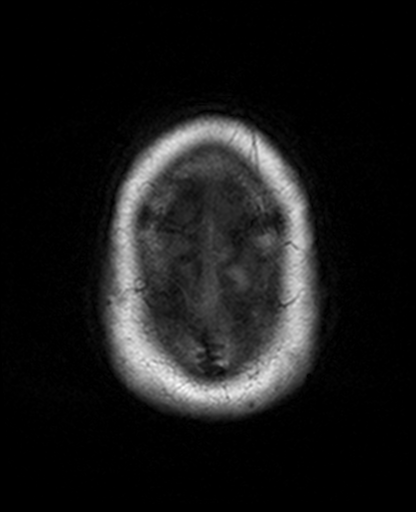
[im 104/104]
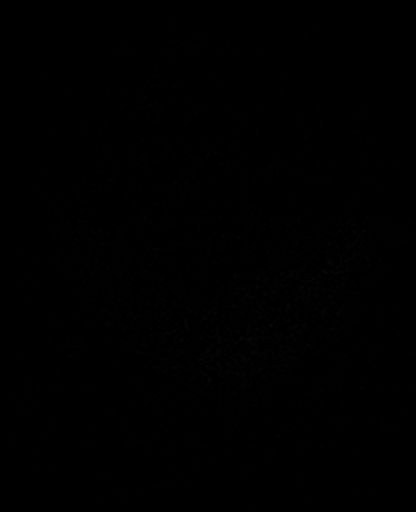

[Series 11: T2 · coronal · 5.0mm · 0.75mm/px · 3 of 28 slices shown (3 of 3)]
[im 1/28]
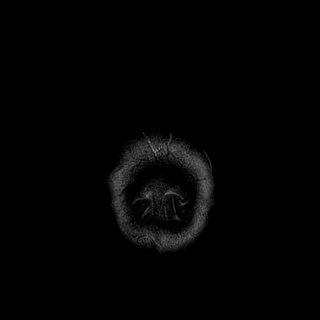
[im 14/28]
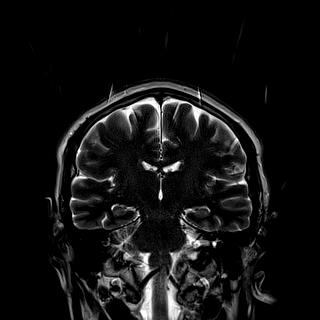
[im 28/28]
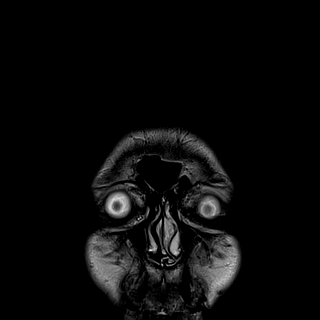

[34 of 48 positions shown; findings below may reference images not displayed]

FINDINGS: Brain: No restricted diffusion to suggest acute infarction. No
midline shift, mass effect, evidence of mass lesion,
ventriculomegaly, extra-axial collection or acute intracranial
hemorrhage. Cervicomedullary junction and pituitary are within
normal limits. Cerebral volume is within normal limits for age.

Gray and white matter signal is within normal limits throughout the
brain. No encephalomalacia or chronic cerebral blood products.

Vascular: Major intracranial vascular flow voids are preserved.

Skull and upper cervical spine: Normal visible cervical spine.
Visualized bone marrow signal is within normal limits.

Sinuses/Orbits: Negative.

Other: Visible internal auditory structures appear normal. Mastoids
are clear. Scalp and face soft tissues appear negative.
IMPRESSION: Normal noncontrast MRI appearance of the brain.

## 2019-08-19 IMAGING — MR MR MRA HEAD W/O CM
2 series · 16 of 48 positions shown · non-contrast
Comparison: Brain MRI and neck MRA today reported separately.

CLINICAL DATA: 54-year-old male with persistent headache for 3
days. Subacute neurologic deficit(s).

EXAM:
MRA HEAD WITHOUT CONTRAST
TECHNIQUE: Angiographic images of the Circle of Willis were obtained using MRA
technique without intravenous contrast.

[Series 1: TOF fat-sat · axial · 0.8mm · 0.38mm/px · z∈[-141,-33]mm · 15 of 143 slices shown]
[im 1/143]
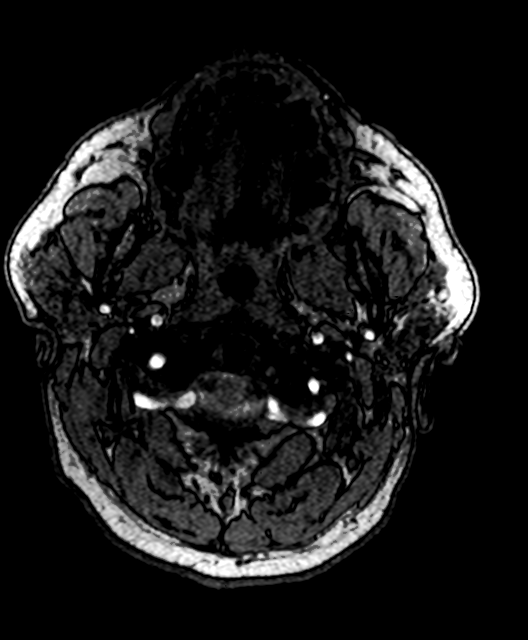
[im 4/143]
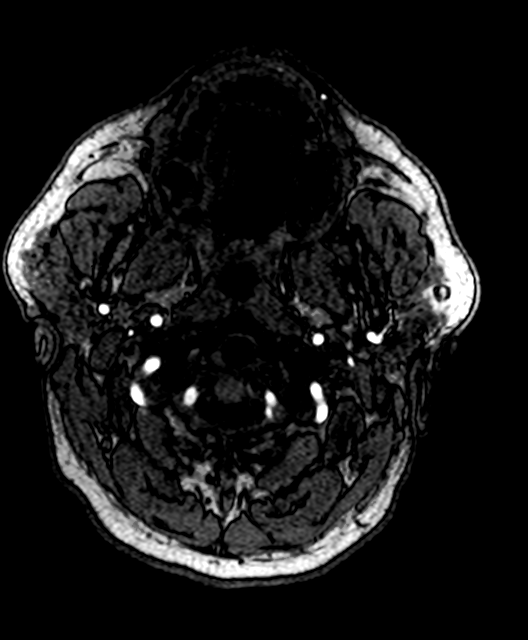
[im 7/143]
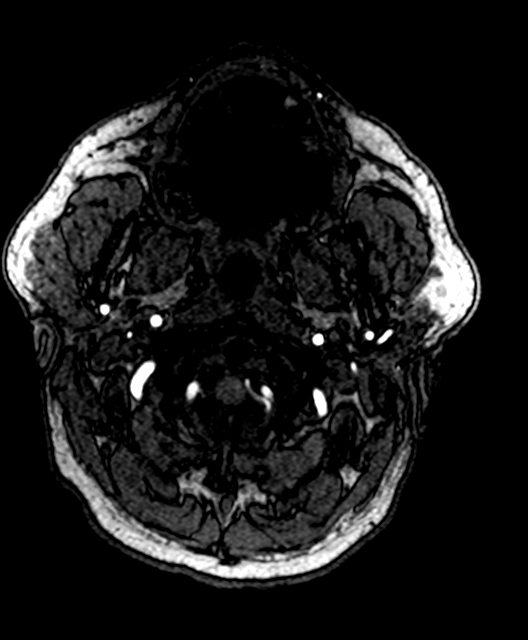
[im 10/143]
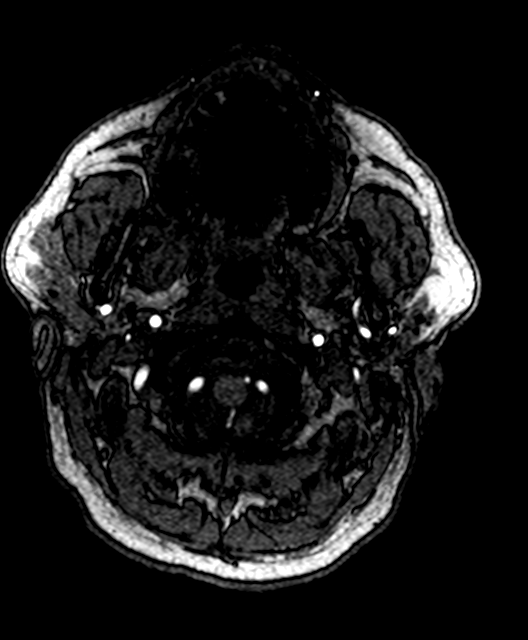
[im 13/143]
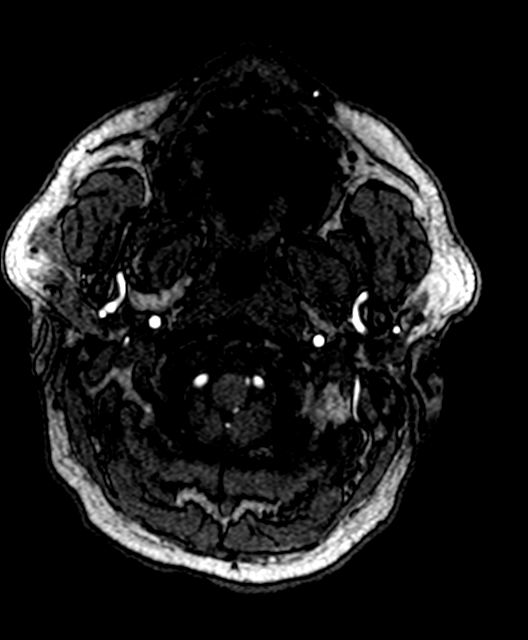
[im 22/143]
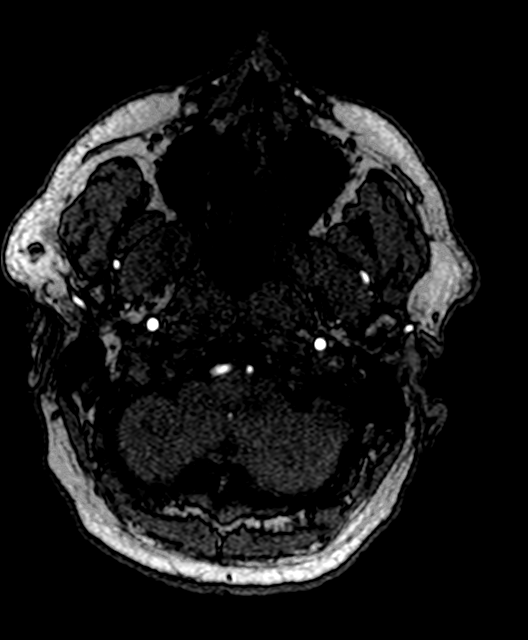
[im 25/143]
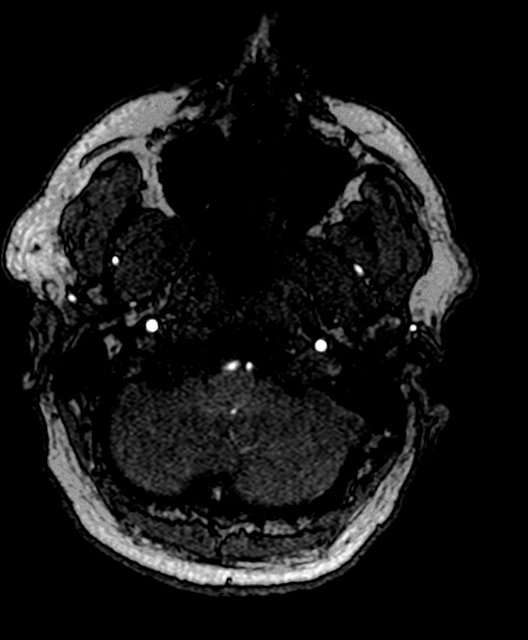
[im 44/143]
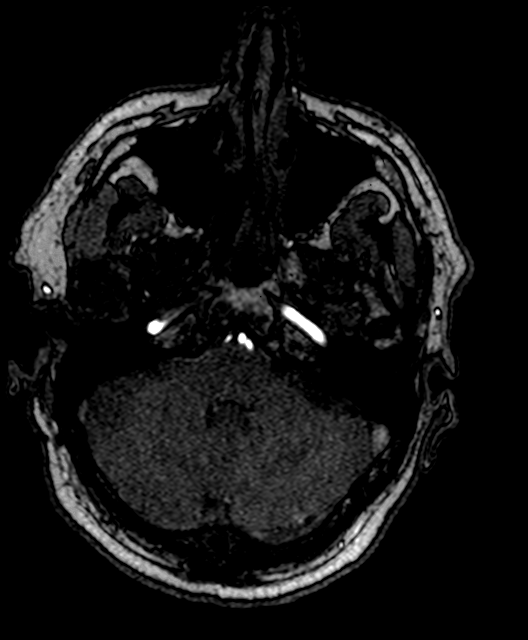
[im 62/143]
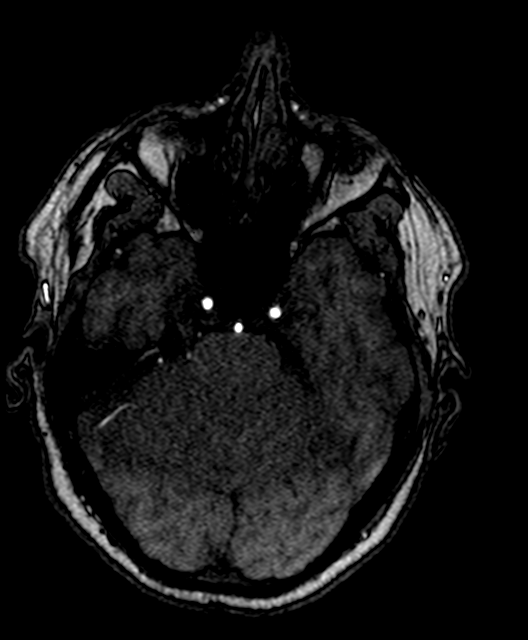
[im 72/143]
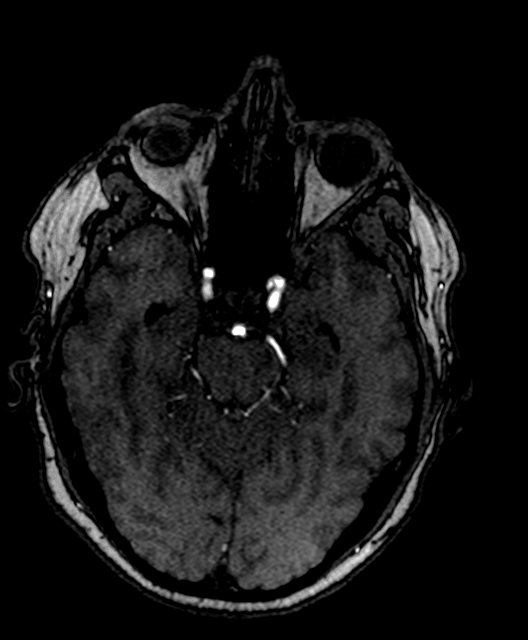
[im 81/143]
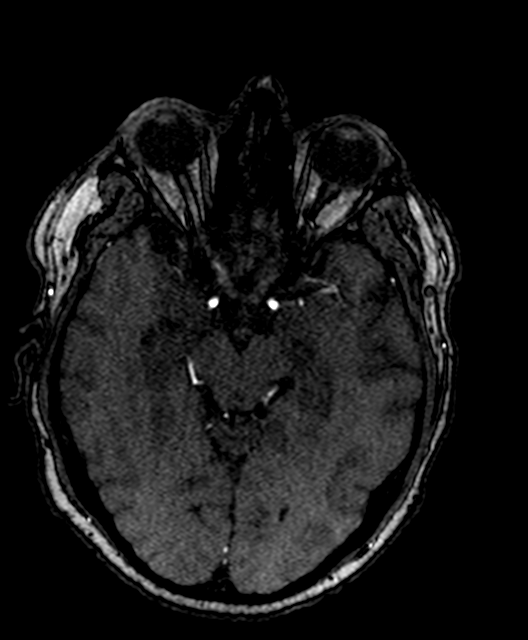
[im 99/143]
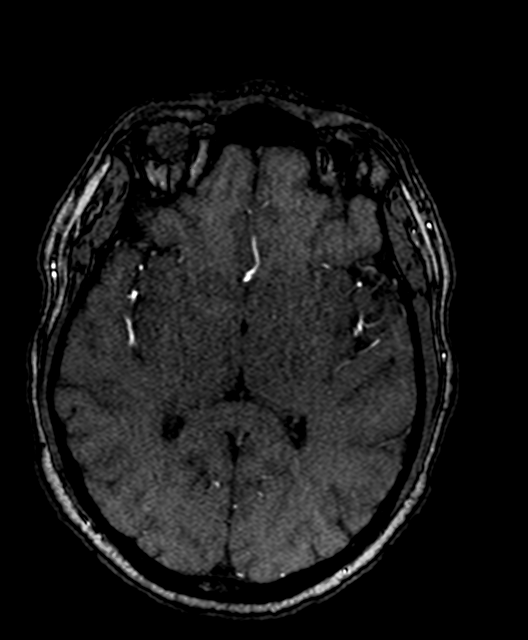
[im 118/143]
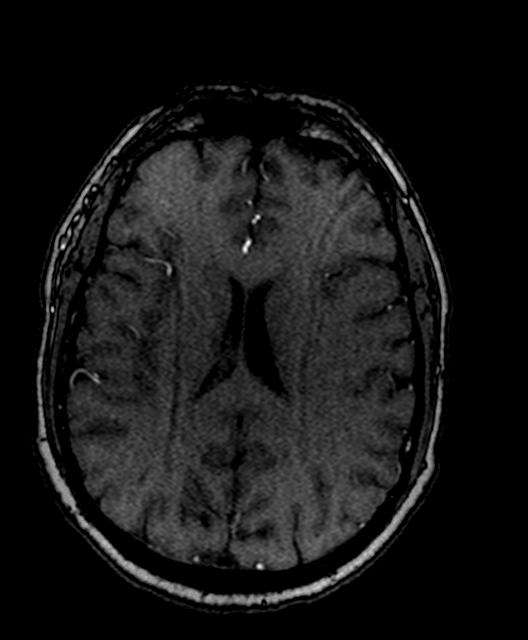
[im 121/143]
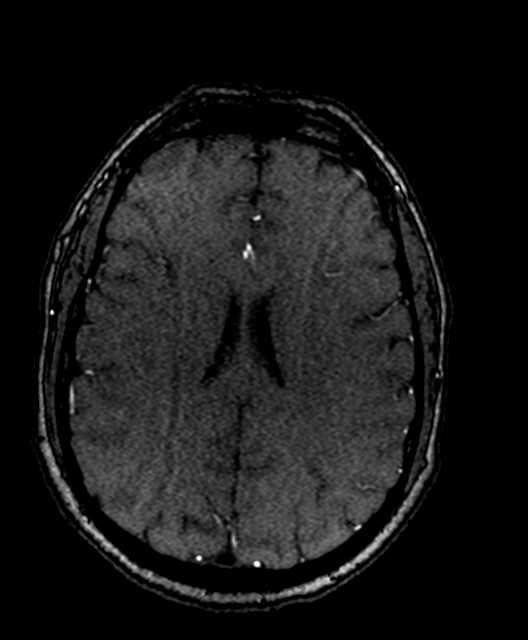
[im 136/143]
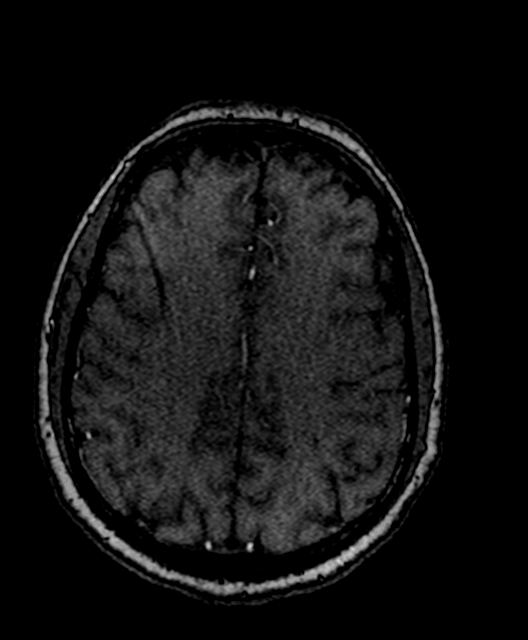

[Series 100: <mip range> · axial · 0.26mm/px · 1 of 1 slices shown]
[im 1/1]
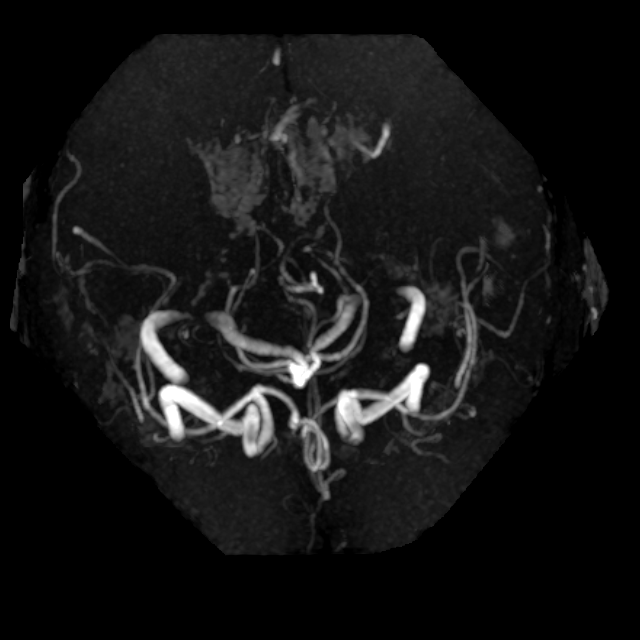

[16 of 48 positions shown; findings below may reference images not displayed]

FINDINGS: Antegrade flow in the posterior circulation with fairly codominant
distal vertebral arteries. Normal PICA origins and vertebrobasilar
junction (the vertebrobasilar junction is slightly elongated, normal
variant). Normal AICA origins. Patent basilar artery without
stenosis. Normal SCA and PCA origins. Posterior communicating
arteries are diminutive or absent. Bilateral PCA branches are within
normal limits.

Antegrade flow in both ICA siphons. No siphon stenosis. Normal
ophthalmic artery origins. Patent carotid termini. Normal MCA and
ACA origins. Anterior communicating artery and visible ACA branches
are within normal limits. Bilateral MCA M1 segments and MCA
bi/trifurcations are patent without stenosis. Visible bilateral MCA
branches are within normal limits.
IMPRESSION: Normal intracranial MRA.

## 2019-08-19 MED ORDER — CYANOCOBALAMIN 1000 MCG/ML IJ SOLN
1000.0000 ug | Freq: Every day | INTRAMUSCULAR | Status: DC
  Administered 2019-08-20: 1000 ug via INTRAMUSCULAR
  Filled 2019-08-19: qty 1

## 2019-08-19 MED ORDER — TOPIRAMATE 25 MG PO TABS
25.0000 mg | ORAL_TABLET | Freq: Every day | ORAL | Status: DC
  Administered 2019-08-19 – 2019-08-20 (×2): 25 mg via ORAL
  Filled 2019-08-19 (×2): qty 1

## 2019-08-19 MED ORDER — TOPIRAMATE 25 MG PO TABS: 25.0000 mg | ORAL_TABLET | Freq: Every day | ORAL | Status: DC

## 2019-08-19 MED ORDER — CYANOCOBALAMIN 1000 MCG/ML IJ SOLN
1000.0000 ug | Freq: Once | INTRAMUSCULAR | Status: AC
  Administered 2019-08-19: 1000 ug via INTRAMUSCULAR
  Filled 2019-08-19: qty 1

## 2019-08-19 MED ORDER — CYANOCOBALAMIN 1000 MCG/ML IJ SOLN: 1000.0000 ug | INTRAMUSCULAR | Status: DC

## 2019-08-19 MED ORDER — TOPIRAMATE 25 MG PO TABS: 25.0000 mg | ORAL_TABLET | Freq: Two times a day (BID) | ORAL | Status: DC

## 2019-08-19 MED ORDER — GADOBUTROL 1 MMOL/ML IV SOLN
10.0000 mL | Freq: Once | INTRAVENOUS | Status: AC | PRN
  Administered 2019-08-19: 10 mL via INTRAVENOUS

## 2019-08-19 NOTE — Plan of Care (Signed)
°  Problem: Acute Rehab PT Goals(only PT should resolve) Goal: Patient Will Transfer Sit To/From Stand Outcome: Progressing Flowsheets (Taken 08/19/2019 1155) Patient will transfer sit to/from stand: with modified independence Goal: Pt Will Ambulate Outcome: Progressing Flowsheets (Taken 08/19/2019 1155) Pt will Ambulate:  > 125 feet  with modified independence  with least restrictive assistive device Goal: Pt Will Go Up/Down Stairs Outcome: Progressing Flowsheets (Taken 08/19/2019 1155) Pt will Go Up / Down Stairs:  Flight  with modified independence  with rail(s)   Domenick Bookbinder PT, DPT 08/19/19, 11:56 AM [phone]

## 2019-08-19 NOTE — Progress Notes (Signed)
[facility] Sahir Tolson A. Gerilyn Pilgrim, MD     [email or web address]          Derek Lang is an 54 y.o. male.   ASSESSMENT/PLAN: 1.  Patient has multiple symptoms of unclear etiology.  The symptoms includes dizziness, headaches and dysarthria.  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  Given this and the prominent headache, the patient will be started on Topamax.  Side effects are discussed with the patient. 2.  Vitamin B12 deficiency which will be replaced.   He reports still having posterior headaches which went to severe radiates to the frontal periorbital region.  No additional episodes of severe vertiginous symptoms are reported.    GENERAL: The patient is currently doing decently.  He is in no acute distress.  HEENT: Neck is supple no trauma noted.  The patient does have a large tongue and crowded reddened airway.  ABDOMEN: soft  EXTREMITIES: No edema   BACK: Normal  SKIN: Normal by inspection.    MENTAL STATUS: Alert and oriented. Speech -he still has multiple stuttering type dysarthria, language and cognition are generally intact. Judgment and insight normal.   CRANIAL NERVES: Pupils are equal, round and reactive to light and accomodation; extra ocular movements are full, there is no significant nystagmus; visual fields are full; upper and lower facial muscles are normal in strength and symmetric, there is no flattening of the nasolabial folds; tongue is midline; uvula is midline; shoulder elevation is normal.  MOTOR: Normal tone, bulk and strength; no pronator drift..   SENSATION: Normal to light touch, temperature, and pain.      Blood pressure 118/74, pulse 64, temperature 98.4 F (36.9 C), temperature source Oral, resp. rate 19, height 6' (1.829 m), weight 103 kg, SpO2 95 %.  Past Medical History:  Diagnosis Date  . ED (erectile dysfunction)   . Hypercholesterolemia   . Hypertension     Past Surgical History:  Procedure Laterality Date    . ACHILLES TENDON REPAIR    . APPENDECTOMY    . FOOT SURGERY      No family history on file.  Social History:  reports that he has never smoked. He has never used smokeless tobacco. He reports current alcohol use. He reports that he does not use drugs.  Allergies:  Allergies  Allergen Reactions  . Iodine   . Tape     Medications: Prior to Admission medications   Medication Sig Start Date End Date Taking? Authorizing Provider  Cholecalciferol 125 MCG (5000 UT) capsule Take 1 capsule by mouth daily.   Yes [provider]  losartan (COZAAR) 100 MG tablet Take 1 tablet by mouth daily. 05/02/18  Yes [provider]  omeprazole (PRILOSEC) 20 MG capsule Take 20 mg by mouth daily.   Yes [provider]  simvastatin (ZOCOR) 40 MG tablet Take 1 tablet by mouth daily. 05/02/18  Yes [provider]  tadalafil (CIALIS) 20 MG tablet Take 10 mg by mouth daily as needed. 08/04/19  Yes [provider]    Scheduled Meds: . aspirin  81 mg Oral Daily  . enoxaparin (LOVENOX) injection  40 mg Subcutaneous Q24H  . nicotine  7 mg Transdermal Daily  . pantoprazole  40 mg Oral Daily  . simvastatin  40 mg Oral Daily  . topiramate  25 mg Oral Daily   Continuous Infusions:  PRN Meds:.acetaminophen **OR** acetaminophen (TYLENOL) oral liquid 160 mg/5 mL **OR** acetaminophen, butalbital-acetaminophen-caffeine, labetalol, senna-docusate     Results for orders placed or performed  during the hospital encounter of 08/18/19 (from the past 48 hour(s))  Protime-INR     Status: None   Collection Time: 08/18/19  7:47 AM  Result Value Ref Range   Prothrombin Time 12.8 11.4 - 15.2 seconds   INR 1.0 0.8 - 1.2    Comment: (NOTE) INR goal varies based on device and disease states. Performed at [facility], [address] [place], [place] [address]   APTT     Status: None   Collection Time: 08/18/19  7:47 AM  Result Value Ref Range   aPTT 29 24 - 36 seconds     Comment: Performed at [facility], [address], [place], [place] [address]  CBC     Status: None   Collection Time: 08/18/19  7:47 AM  Result Value Ref Range   WBC 6.7 4.0 - 10.5 K/uL   RBC 5.04 4.22 - 5.81 MIL/uL   Hemoglobin 15.4 13.0 - 17.0 g/dL   HCT 10.2 39 - 52 %   MCV 90.5 80.0 - 100.0 fL   MCH 30.6 26.0 - 34.0 pg   MCHC 33.8 30.0 - 36.0 g/dL   RDW 58.5 27.7 - 82.4 %   Platelets 188 150 - 400 K/uL   nRBC 0.0 0.0 - 0.2 %    Comment: Performed at [facility], [address] [place], [place] [address]  Differential     Status: None   Collection Time: 08/18/19  7:47 AM  Result Value Ref Range   Neutrophils Relative % 67 %   Neutro Abs 4.5 1.7 - 7.7 K/uL   Lymphocytes Relative 18 %   Lymphs Abs 1.2 0.7 - 4.0 K/uL   Monocytes Relative 11 %   Monocytes Absolute 0.7 0 - 1 K/uL   Eosinophils Relative 2 %   Eosinophils Absolute 0.1 0 - 0 K/uL   Basophils Relative 1 %   Basophils Absolute 0.1 0 - 0 K/uL   Immature Granulocytes 1 %   Abs Immature Granulocytes 0.04 0.00 - 0.07 K/uL    Comment: Performed at [facility], [address] [place], [place] [address]  Comprehensive metabolic panel     Status: Abnormal   Collection Time: 08/18/19  7:47 AM  Result Value Ref Range   Sodium 137 135 - 145 mmol/L   Potassium 3.9 3.5 - 5.1 mmol/L   Chloride 105 98 - 111 mmol/L   CO2 23 22 - 32 mmol/L   Glucose, Bld 107 (H) 70 - 99 mg/dL    Comment: Glucose reference range applies only to samples taken after fasting for at least 8 hours.   BUN 11 6 - 20 mg/dL   Creatinine, Ser 5.40 0.61 - 1.24 mg/dL   Calcium 8.6 (L) 8.9 - 10.3 mg/dL   Total Protein 6.8 6.5 - 8.1 g/dL   Albumin 3.9 3.5 - 5.0 g/dL   AST 19 15 - 41 U/L   ALT 25 0 - 44 U/L   Alkaline Phosphatase 102 38 - 126 U/L   Total Bilirubin 0.4 0.3 - 1.2 mg/dL   GFR calc non Af Amer >60 >60 mL/min   GFR calc Af Amer >60 >60 mL/min   Anion gap 9 5 - 15    Comment: Performed at [facility], [address] [place], [place]  [address]  HIV Antibody (routine testing w rflx)     Status: None   Collection Time: 08/18/19  7:47 AM  Result Value Ref Range   HIV Screen 4th Generation wRfx  Non Reactive Non Reactive    Comment: Performed at [facility], [address], [place], [place] [address]  CBG monitoring, ED     Status: None   Collection Time: 08/18/19  7:59 AM  Result Value Ref Range   Glucose-Capillary 98 70 - 99 mg/dL    Comment: Glucose reference range applies only to samples taken after fasting for at least 8 hours.  Urinalysis, Routine w reflex microscopic     Status: None   Collection Time: 08/18/19 10:44 AM  Result Value Ref Range   Color, Urine YELLOW YELLOW   APPearance CLEAR CLEAR   Specific Gravity, Urine 1.015 1.005 - 1.030   pH 6.0 5.0 - 8.0   Glucose, UA NEGATIVE NEGATIVE mg/dL   Hgb urine dipstick NEGATIVE NEGATIVE   Bilirubin Urine NEGATIVE NEGATIVE   Ketones, ur NEGATIVE NEGATIVE mg/dL   Protein, ur NEGATIVE NEGATIVE mg/dL   Nitrite NEGATIVE NEGATIVE   Leukocytes,Ua NEGATIVE NEGATIVE    Comment: Performed at [facility], [address] [place], [place] [address]  Urine rapid drug screen (hosp performed)     Status: None   Collection Time: 08/18/19 10:45 AM  Result Value Ref Range   Opiates NONE DETECTED NONE DETECTED   Cocaine NONE DETECTED NONE DETECTED   Benzodiazepines NONE DETECTED NONE DETECTED   Amphetamines NONE DETECTED NONE DETECTED   Tetrahydrocannabinol NONE DETECTED NONE DETECTED   Barbiturates NONE DETECTED NONE DETECTED    Comment: (NOTE) DRUG SCREEN FOR MEDICAL PURPOSES ONLY.  IF CONFIRMATION IS NEEDED FOR ANY PURPOSE, NOTIFY LAB WITHIN 5 DAYS.  LOWEST DETECTABLE LIMITS FOR URINE DRUG SCREEN Drug Class                     Cutoff (ng/mL) Amphetamine and metabolites    1000 Barbiturate and metabolites    200 Benzodiazepine                 200 Tricyclics and metabolites     300 Opiates and metabolites        300 Cocaine and metabolites        300 THC                             50 Performed at [facility], [address], [place], [place] [address]   Vitamin B12     Status: Abnormal   Collection Time: 08/18/19 10:47 AM  Result Value Ref Range   Vitamin B-12 153 (L) 180 - 914 pg/mL    Comment: (NOTE) This assay is not validated for testing neonatal or myeloproliferative syndrome specimens for Vitamin B12 levels. Performed at [facility], [address] [place], [place] [address]   TSH     Status: None   Collection Time: 08/18/19 10:47 AM  Result Value Ref Range   TSH 0.604 0.350 - 4.500 uIU/mL    Comment: Performed by a 3rd Generation assay with a functional sensitivity of <=0.01 uIU/mL. Performed at [facility], [address], [place], [place] [address]   Ammonia     Status: Abnormal   Collection Time: 08/18/19 10:47 AM  Result Value Ref Range   Ammonia 36 (H) 9 - 35 umol/L    Comment: Performed at [facility], [address] [place], [place] [address]  SARS Coronavirus 2 by RT PCR (hospital order, performed in [facility] hospital lab) Nasopharyngeal Nasopharyngeal Swab     Status: None   Collection  Time: 08/18/19 11:18 AM   Specimen: Nasopharyngeal Swab  Result Value Ref Range   SARS Coronavirus 2 NEGATIVE NEGATIVE    Comment: (NOTE) SARS-CoV-2 target nucleic acids are NOT DETECTED.  The SARS-CoV-2 RNA is generally detectable in upper and lower respiratory specimens during the acute phase of infection. The lowest concentration of SARS-CoV-2 viral copies this assay can detect is 250 copies / mL. A negative result does not preclude SARS-CoV-2 infection and should not be used as the sole basis for treatment or other patient management decisions.  A negative result may occur with improper specimen collection / handling, submission of specimen other than nasopharyngeal swab, presence of viral mutation(s) within the areas targeted by this assay, and inadequate number of viral copies (<250 copies / mL). A negative result must be  combined with clinical observations, patient history, and epidemiological information.  Fact Sheet for Patients:   BoilerBrush.com.cyhttps://www.fda.gov/media/136312/download  Fact Sheet for Healthcare Providers: https://pope.com/https://www.fda.gov/media/136313/download  This test is not yet approved or  cleared by the [place] FDA and has been authorized for detection and/or diagnosis of SARS-CoV-2 by FDA under an Emergency Use Authorization (EUA).  This EUA will remain in effect (meaning this test can be used) for the duration of the COVID-19 declaration under Section 564(b)(1) of the Act, 21 U.S.C. section 360bbb-3(b)(1), unless the authorization is terminated or revoked sooner.  Performed at [facility], [address] [place], [place] [address]   Glucose, capillary     Status: None   Collection Time: 08/18/19  4:20 PM  Result Value Ref Range   Glucose-Capillary 89 70 - 99 mg/dL    Comment: Glucose reference range applies only to samples taken after fasting for at least 8 hours.  Hemoglobin A1c     Status: None   Collection Time: 08/19/19  5:47 AM  Result Value Ref Range   Hgb A1c MFr Bld 5.4 4.8 - 5.6 %    Comment: (NOTE) Pre diabetes:          5.7%-6.4%  Diabetes:              >6.4%  Glycemic control for   <7.0% adults with diabetes    Mean Plasma Glucose 108.28 mg/dL    Comment: Performed at [facility], [address] [place], [place] [address]  Lipid panel     Status: Abnormal   Collection Time: 08/19/19  5:47 AM  Result Value Ref Range   Cholesterol 137 0 - 200 mg/dL   Triglycerides 865183 (H) <150 mg/dL   HDL 25 (L) >78>40 mg/dL   Total CHOL/HDL Ratio 5.5 RATIO   VLDL 37 0 - 40 mg/dL   LDL Cholesterol 75 0 - 99 mg/dL    Comment:        Total Cholesterol/HDL:CHD Risk Coronary Heart Disease Risk Table                     Men   Women  1/2 Average Risk   3.4   3.3  Average Risk       5.0   4.4  2 X Average Risk   9.6   7.1  3 X Average Risk  23.4   11.0        Use the calculated  Patient Ratio above and the CHD Risk Table to determine the patient's CHD Risk.        ATP III CLASSIFICATION (LDL):  <100     mg/dL   Optimal  [id number]  mg/dL   Near or Above                    Optimal  130-159  mg/dL   Borderline  160-189  mg/dL   High  >190     mg/dL   Very High Performed at [facility], [address], [place], [place] [address]     Studies/Results:   HEAD CT FINDINGS: Brain: No evidence of acute infarction, hemorrhage, hydrocephalus, extra-axial collection or mass lesion/mass effect.  Vascular: No hyperdense vessel or unexpected calcification.  Skull: Normal. Negative for fracture or focal lesion.  Sinuses/Orbits: No acute finding.  Other: None.  IMPRESSION: No acute intracranial pathology    The head CT scan is reviewed in person in shows no acute changes. There is no evidence of hypodensity, encephalomalacia or fluid collection. There is calcified choroid plexus.       BRAIN MRA-MRI  FINDINGS: Antegrade flow in the posterior circulation with fairly codominant distal vertebral arteries. Normal PICA origins and vertebrobasilar junction (the vertebrobasilar junction is slightly elongated, normal variant). Normal AICA origins. Patent basilar artery without stenosis. Normal SCA and PCA origins. Posterior communicating arteries are diminutive or absent. Bilateral PCA branches are within normal limits.  Antegrade flow in both ICA siphons. No siphon stenosis. Normal ophthalmic artery origins. Patent carotid termini. Normal MCA and ACA origins. Anterior communicating artery and visible ACA branches are within normal limits. Bilateral MCA M1 segments and MCA bi/trifurcations are patent without stenosis. Visible bilateral MCA branches are within normal limits.  IMPRESSION: Normal intracranial MRA      FINDINGS: Brain: No restricted diffusion to suggest acute infarction. No midline shift, mass effect, evidence of mass  lesion, ventriculomegaly, extra-axial collection or acute intracranial hemorrhage. Cervicomedullary junction and pituitary are within normal limits. Cerebral volume is within normal limits for age.  Pearline Cables and white matter signal is within normal limits throughout the brain. No encephalomalacia or chronic cerebral blood products.  Vascular: Major intracranial vascular flow voids are preserved.  Skull and upper cervical spine: Normal visible cervical spine. Visualized bone marrow signal is within normal limits.  Sinuses/Orbits: Negative.  Other: Visible internal auditory structures appear normal. Mastoids are clear. Scalp and face soft tissues appear negative.  IMPRESSION: Normal noncontrast MRI appearance of the brain.   BRAIN MRI SCAN IS REVIEWED IN PERSON. No abnormal changes are noted on DWI. FLAIR scan shows no abnormal white matter lesions. There is no encephalomalacia. The scan appears to be normal.    NECK MRA IMPRESSION: Normal Neck MRA.   Jimena Wieczorek A. Merlene Laughter, M.D.  Diplomate, Tax adviser of Psychiatry and Neurology ( Neurology). 08/19/2019, 6:14 PM

## 2019-08-19 NOTE — Evaluation (Signed)
Occupational Therapy Evaluation Patient Details Name: Derek Lang MRN: [id number] DOB: Oct 20, 1965 Today's Date: 08/19/2019    History of Present Illness Derek Lang is a 54 y.o. male with medical history significant for hypertension, dyslipidemia, occasional alcohol use, and ongoing tobacco abuse who presented today with complaints of headache that began 2 days ago on Saturday.  He states that the headache was quite severe and started on the frontal side of his head and radiated towards the back.  He was also noted to have some speech slurring at that time which then continued to worsen into Sunday and was also accompanied with double vision as well as some dizziness and gait difficulties.  He states that he has some worsening dizziness when he lays down and tends to improve with standing up.  His symptoms worsened today which prompted the ED visit.  He denies any weakness or numbness in his arms or legs.  He rates the pain level currently 3/10.  He has tried some over-the-counter ibuprofen and Tylenol with minimal assistance with his pain.  He denies any fever, chills, nausea, vomiting, chest pain, shortness of breath, or abdominal pain. MRI pending   Clinical Impression   Pt agreeable to OT evaluation, requesting to use restroom on OT arrival. Reports continued HA at base of skull, OT notes intermittent slurred speech throughout evaluation. Pt using RW during mobility for safety in context of dizziness and LE ataxia. Did not experiencing dizziness during standing tasks or mobility during evaluation. Pt performing ADLs with supervision for safety during standing. Pt demonstrates BUE strength WNL, sensation and coordination are intact.  No further OT services required at this time. Pt demonstrates safe completion of ADLs in context of current symptoms. Defer discharge recommendation to PT for safe mobility needs.     Follow Up Recommendations  No OT follow up;Supervision - Intermittent    Equipment  Recommendations  None recommended by OT       Precautions / Restrictions Precautions Precautions: Fall Restrictions Weight Bearing Restrictions: No      Mobility Bed Mobility Overal bed mobility: Independent                Transfers Overall transfer level: Needs assistance Equipment used: Rolling walker (2 wheeled) Transfers: Sit to/from Stand Sit to Stand: Supervision         General transfer comment: Pt using RW for safety due to occasional dizziness         ADL either performed or assessed with clinical judgement   ADL Overall ADL's : Needs assistance/impaired     Grooming: Wash/dry hands;Oral care;Supervision/safety;Standing Grooming Details (indicate cue type and reason): Pt standing for tasks, no LOB             Lower Body Dressing: Modified independent;Sitting/lateral leans Lower Body Dressing Details (indicate cue type and reason): donning socks at EOB, no difficulty Toilet Transfer: Supervision/safety;Ambulation;RW   Toileting- Clothing Manipulation and Hygiene: Supervision/safety;Sit to/from stand       Functional mobility during ADLs: Supervision/safety;Rolling walker       Vision Baseline Vision/History: Wears glasses Wears Glasses: At all times Patient Visual Report: No change from baseline Vision Assessment?: No apparent visual deficits            Pertinent Vitals/Pain Pain Assessment: No/denies pain     Hand Dominance Left   Extremity/Trunk Assessment Upper Extremity Assessment Upper Extremity Assessment: Overall WFL for tasks assessed   Lower Extremity Assessment Lower Extremity Assessment: Defer to PT evaluation   Cervical / Trunk  Assessment Cervical / Trunk Assessment: Normal   Communication Communication Communication: Expressive difficulties (slurred speech)   Cognition Arousal/Alertness: Awake/alert Behavior During Therapy: WFL for tasks assessed/performed Overall Cognitive Status: Within Functional Limits  for tasks assessed                                                Home Living Family/patient expects to be discharged to:: Private residence Living Arrangements: Spouse/significant other Available Help at Discharge: Family;Available PRN/intermittently Type of Home: House Home Access: Stairs to enter Entrance Stairs-Number of Steps: 3   Home Layout: Two level Alternate Level Stairs-Number of Steps: 13 Alternate Level Stairs-Rails: Right Bathroom Shower/Tub: Chief Strategy Officer: Standard     Home Equipment: Crutches          Prior Functioning/Environment Level of Independence: Independent        Comments: independent in ADLs, mobility, 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

## 2019-08-19 NOTE — Evaluation (Signed)
f Speech Language Pathology Evaluation Patient Details Name: Derek Lang MRN: [id number] DOB: [birth year] Today's Date: 08/19/2019 Time: 6948-5462 SLP Time Calculation (min) (ACUTE ONLY): 29 min  Problem List:  Patient Active Problem List   Diagnosis Date Noted  . CVA (cerebral vascular accident) ([place]) 08/18/2019   Past Medical History:  Past Medical History:  Diagnosis Date  . ED (erectile dysfunction)   . Hypercholesterolemia   . Hypertension    Past Surgical History:  Past Surgical History:  Procedure Laterality Date  . ACHILLES TENDON REPAIR    . APPENDECTOMY    . FOOT SURGERY     HPI:  Derek Lang is a 54 y.o. male with medical history significant for hypertension, dyslipidemia, occasional alcohol use, and ongoing tobacco abuse who presented today with complaints of headache that began 2 days ago on Saturday.  He states that the headache was quite severe and started on the frontal side of his head and radiated towards the back.  He was also noted to have some speech slurring at that time which then continued to worsen into Sunday and was also accompanied with double vision as well as some dizziness and gait difficulties.  He states that he has some worsening dizziness when he lays down and tends to improve with standing up.  His symptoms worsened today which prompted the ED visit.  He denies any weakness or numbness in his arms or legs.  He rates the pain level currently 3/10.  He has tried some over-the-counter ibuprofen and Tylenol with minimal assistance with his pain.  He denies any fever, chills, nausea, vomiting, chest pain, shortness of breath, or abdominal pain. MRI is negative for actute changes. SLE requested.   Assessment / Plan / Recommendation Clinical Impression  Pt presents with telegraphic, deliberate and mildly dysarthric speech pattern that is 75% intelligible within complex conversation, but pt stated "this is the best my speech has been since Saturday."  Wife  and pt noted his speech "waxes and wanes" during conversational tasks.  Pt given strategies to increase breath support to increase intelligibility overall.  Pt aware of deficits with speech/gait; given [facility] Cognitive Assessment) with a normal score of 29/30 obtained throughout assessment.  Other areas of language/cognition appear WFL.  ST will f/u during acute stay to provide compensatory strategies for increasing overall intelligiblility.  Thank you for this consult.    SLP Assessment  SLP Recommendation/Assessment: Patient needs continued Speech Language Pathology Services SLP Visit Diagnosis: Dysarthria and anarthria (R47.1)    Follow Up Recommendations  Other (comment) (TBD)    Frequency and Duration min 1 x/week  1 week      SLP Evaluation Cognition  Overall Cognitive Status: Within Functional Limits for tasks assessed Arousal/Alertness: Awake/alert Orientation Level: Oriented X4 Memory: Appears intact Awareness: Appears intact Problem Solving: Appears intact Safety/Judgment: Appears intact       Comprehension  Auditory Comprehension Overall Auditory Comprehension: Appears within functional limits for tasks assessed Yes/No Questions: Within Functional Limits Commands: Within Functional Limits Conversation: Complex Other Conversation Comments:  (Dysarthric/deliberate speech pattern noted throughout SLE) EffectiveTechniques: Slowed speech;Repetition;Pausing;Increased volume Visual Recognition/Discrimination Discrimination: Within Function Limits Reading Comprehension Reading Status: Within funtional limits    Expression Expression Primary Mode of Expression: Verbal Verbal Expression Overall Verbal Expression: Appears within functional limits for tasks assessed Initiation: No impairment Level of Generative/Spontaneous Verbalization: Conversation Repetition: No impairment Naming: No impairment Pragmatics: No impairment Interfering Components: Speech  intelligibility;Other (comment) (deliberate, telegraphic type speech pattern) Non-Verbal Means of Communication: Not  applicable Written Expression Dominant Hand: Left Written Expression: Within Functional Limits   Oral / Motor  Oral Motor/Sensory Function Overall Oral Motor/Sensory Function: Within functional limits Motor Speech Overall Motor Speech: Other (comment) Respiration: Within functional limits Phonation: Normal Resonance: Within functional limits Articulation: Impaired Level of Impairment: Conversation Intelligibility: Intelligibility reduced Word: 50-74% accurate Phrase: 50-74% accurate Sentence: 50-74% accurate Conversation: 50-74% accurate Motor Planning: Impaired Level of Impairment: Sentence Effective Techniques: Slow rate;Increased vocal intensity;Over-articulate;Pause                       Tressie Stalker, M.S., CCC-SLP 08/19/2019, 3:17 PM

## 2019-08-19 NOTE — Progress Notes (Signed)
EEG Completed; Results Pending  

## 2019-08-19 NOTE — Progress Notes (Signed)
PROGRESS NOTE    Derek Lang  ZOX:[id number] DOB: Aug 25, 1965 DOA: 08/18/2019 PCP: Florene Glen, NP   Chief Complaint  Patient presents with  . Headache    Brief Narrative:  As per H&P written by Dr. Sherryll Burger on 08/18/2019 54 y.o. male with medical history significant for hypertension, dyslipidemia, occasional alcohol use, and ongoing tobacco abuse who presented today with complaints of headache that began 2 days ago on Saturday.  He states that the headache was quite severe and started on the frontal side of his head and radiated towards the back.  He was also noted to have some speech slurring at that time which then continued to worsen into Sunday and was also accompanied with double vision as well as some dizziness and gait difficulties.  He states that he has some worsening dizziness when he lays down and tends to improve with standing up.  His symptoms worsened today which prompted the ED visit.  He denies any weakness or numbness in his arms or legs.  He rates the pain level currently 3/10.  He has tried some over-the-counter ibuprofen and Tylenol with minimal assistance with his pain.  He denies any fever, chills, nausea, vomiting, chest pain, shortness of breath, or abdominal pain.   ED Course: Stable vital signs noted and laboratory data without any acute abnormalities.  CT of the head with no acute findings noted either.  EKG was sinus rhythm at 60 bpm.  He is being given Toradol for his headache.  Telemetry neurology evaluation performed with recommendations for MRI head and MRA head and neck without contrast as well as 2D echocardiogram and neurology evaluation.  Multiple lab recommendations ordered as well and pending.  Assessment & Plan: 1-TIA/strokelike symptoms: Multifactorial -With concerns for possible cerebellar/brainstem CVA no seen on CT or MRI -Patient also found with B12 deficiency (currently receiving repletion) -Concerns for partial complex seizure after abnormal EEG  found; will be started on Topamax -The rest of his work-up and telemetry has been unremarkable -neurology on board and will follow further recommendations -continue secondary prevention with aspirin and risk factors modifications. -HHPT/HHOT  2-tobacco abuse -cessation counseling provided -continue nicotine patch  3-dyslipidemia -continue statins  4-HTN -continue PRN labetalol for now -allowing permissive HTN while ruling out ischemic events   5-GERD -continue PPI  6-B12 deficiency -continue repletion  7-migraines -will be treated with topamax -continue tylenol PRN   DVT prophylaxis: Lovenox Code Status: Full code Family Communication: Wife at bedside. Disposition:   Status is: Inpatient  Dispo: The patient is from: home               Anticipated d/c is to: home with home health services.              Anticipated d/c date is: 08/20/19              Patient currently no medically stable for discharge; still with ongoing poor balance, slurred speech and abnormal EEG suspicious for complex partial seizures.  Continue telemetry monitoring, initiate the use of Topamax, continue B12 repletion and follow clinical response.  Based on PT/OT evaluation safe to go home at time of discharge with home health services.       Consultants:   Neurology service   Procedures:  See below for x-ray reports EEG: With abnormal findings, demonstrating single epileptiform appreciated   Antimicrobials:  None   Subjective: Continue to have headache, slurred speech, poor balance and being slow to respond.  No fever, no chest pain,  no nausea, no vomiting, no other focal neurologic or motor deficit appreciated  Objective: Vitals:   08/19/19 0555 08/19/19 1000 08/19/19 1257 08/19/19 1403  BP: (!) 126/92 132/81  118/74  Pulse: 64 65 68 64  Resp:  18 19   Temp: 98.1 F (36.7 C) 97.9 F (36.6 C)  98.4 F (36.9 C)  TempSrc: Oral Oral  Oral  SpO2: 98% 98% 97% 95%  Weight:        Height:        Intake/Output Summary (Last 24 hours) at 08/19/2019 1809 Last data filed at 08/19/2019 1700 Gross per 24 hour  Intake 720 ml  Output 1250 ml  Net -530 ml   Filed Weights   08/18/19 0720  Weight: 103 kg    Examination:  General exam: Appears calm and comfortable; no chest pain, no shortness of breath, no nausea, no vomiting.  Continue present illness slurred speech, slow to respond and complaining of headaches.  No icterus, no nystagmus.  No pronation drift; still having poor balance and requiring the use of walker. Respiratory system: Clear to auscultation. Respiratory effort normal. Cardiovascular system: S1 & S2 heard, RRR. No JVD, murmurs, rubs, gallops or clicks. No pedal edema. Gastrointestinal system: Abdomen is nondistended, soft and nontender. No organomegaly or masses felt. Normal bowel sounds heard. Central nervous system: Alert and oriented.  No pronator drift; positive slurred speech and poor balance reported.  No focal motor deficits appreciated. Extremities: No cyanosis or clubbing. Skin: No rashes, no petechiae. Psychiatry: Judgement and insight appear normal. Mood & affect appropriate.     Data Reviewed: I have personally reviewed following labs and imaging studies  CBC: Recent Labs  Lab 08/18/19 0747  WBC 6.7  NEUTROABS 4.5  HGB 15.4  HCT 45.6  MCV 90.5  PLT 188    Basic Metabolic Panel: Recent Labs  Lab 08/18/19 0747  NA 137  K 3.9  CL 105  CO2 23  GLUCOSE 107*  BUN 11  CREATININE 0.66  CALCIUM 8.6*    GFR: Estimated Creatinine Clearance: 134.1 mL/min (by C-G formula based on SCr of 0.66 mg/dL).  Liver Function Tests: Recent Labs  Lab 08/18/19 0747  AST 19  ALT 25  ALKPHOS 102  BILITOT 0.4  PROT 6.8  ALBUMIN 3.9    CBG: Recent Labs  Lab 08/18/19 0759 08/18/19 1620  GLUCAP 98 89     Recent Results (from the past 240 hour(s))  SARS Coronavirus 2 by RT PCR (hospital order, performed in [facility] hospital  lab) Nasopharyngeal Nasopharyngeal Swab     Status: None   Collection Time: 08/18/19 11:18 AM   Specimen: Nasopharyngeal Swab  Result Value Ref Range Status   SARS Coronavirus 2 NEGATIVE NEGATIVE Final    Comment: (NOTE) SARS-CoV-2 target nucleic acids are NOT DETECTED.  The SARS-CoV-2 RNA is generally detectable in upper and lower respiratory specimens during the acute phase of infection. The lowest concentration of SARS-CoV-2 viral copies this assay can detect is 250 copies / mL. A negative result does not preclude SARS-CoV-2 infection and should not be used as the sole basis for treatment or other patient management decisions.  A negative result may occur with improper specimen collection / handling, submission of specimen other than nasopharyngeal swab, presence of viral mutation(s) within the areas targeted by this assay, and inadequate number of viral copies (<250 copies / mL). A negative result must be combined with clinical observations, patient history, and epidemiological information.  Fact Sheet for Patients:  BoilerBrush.com.cy  Fact Sheet for Healthcare Providers: https://pope.com/  This test is not yet approved or  cleared by the [place] FDA and has been authorized for detection and/or diagnosis of SARS-CoV-2 by FDA under an Emergency Use Authorization (EUA).  This EUA will remain in effect (meaning this test can be used) for the duration of the COVID-19 declaration under Section 564(b)(1) of the Act, 21 U.S.C. section 360bbb-3(b)(1), unless the authorization is terminated or revoked sooner.  Performed at [facility], [address] [place], [place] [address]       Radiology Studies: CT HEAD WO CONTRAST  Result Date: 08/18/2019 CLINICAL DATA:  Headache, normal neuro exam EXAM: CT HEAD WITHOUT CONTRAST TECHNIQUE: Contiguous axial images were obtained from the base of the skull through the vertex without  intravenous contrast. COMPARISON:  None FINDINGS: Brain: No evidence of acute infarction, hemorrhage, hydrocephalus, extra-axial collection or mass lesion/mass effect. Vascular: No hyperdense vessel or unexpected calcification. Skull: Normal. Negative for fracture or focal lesion. Sinuses/Orbits: No acute finding. Other: None. IMPRESSION: No acute intracranial pathology. Electronically Signed   By: Donzetta Kohut M.D.   On: 08/18/2019 08:09   MR ANGIO HEAD WO CONTRAST  Result Date: 08/19/2019 CLINICAL DATA:  54 year old male with persistent headache for 3 days. Subacute neurologic deficit(s). EXAM: MRA HEAD WITHOUT CONTRAST TECHNIQUE: Angiographic images of the Circle of Willis were obtained using MRA technique without intravenous contrast. COMPARISON:  Brain MRI and neck MRA today reported separately. FINDINGS: Antegrade flow in the posterior circulation with fairly codominant distal vertebral arteries. Normal PICA origins and vertebrobasilar junction (the vertebrobasilar junction is slightly elongated, normal variant). Normal AICA origins. Patent basilar artery without stenosis. Normal SCA and PCA origins. Posterior communicating arteries are diminutive or absent. Bilateral PCA branches are within normal limits. Antegrade flow in both ICA siphons. No siphon stenosis. Normal ophthalmic artery origins. Patent carotid termini. Normal MCA and ACA origins. Anterior communicating artery and visible ACA branches are within normal limits. Bilateral MCA M1 segments and MCA bi/trifurcations are patent without stenosis. Visible bilateral MCA branches are within normal limits. IMPRESSION: Normal intracranial MRA. Electronically Signed   By: Odessa Fleming M.D.   On: 08/19/2019 11:25   MR ANGIO NECK W WO CONTRAST  Result Date: 08/19/2019 CLINICAL DATA:  54 year old male with persistent headache for 3 days. Subacute neurologic deficit(s). EXAM: MRA NECK WITHOUT AND WITH CONTRAST TECHNIQUE: Multiplanar and multiecho pulse  sequences of the neck were obtained without and with intravenous contrast. Angiographic images of the neck were obtained using MRA technique without and with intravenous contrast. CONTRAST:  84mL GADAVIST GADOBUTROL 1 MMOL/ML IV SOLN COMPARISON:  Head CT and brain MRI today. FINDINGS: Precontrast time-of-flight images demonstrate antegrade flow in both cervical carotid and vertebral arteries. Vertebral arteries appear codominant and tortuous. Carotid bifurcations are within normal limits. Post-contrast neck MRA images demonstrate a 3 vessel arch configuration. The right CCA, right carotid bifurcation and cervical right ICA appear normal. The left CCA, left carotid bifurcation and cervical left ICA appear normal. Proximal subclavian arteries and vertebral artery origins appear within normal limits. Codominant vertebral arteries with mild V2 tortuosity (greater on the left) appear normal. Patent visible anterior and posterior circulation. See also intracranial MRA reported separately. IMPRESSION: Normal Neck MRA. Electronically Signed   By: Odessa Fleming M.D.   On: 08/19/2019 11:20   MR BRAIN WO CONTRAST  Result Date: 08/19/2019 CLINICAL DATA:  54 year old male with persistent headache for 3 days. Subacute neurologic deficit(s). EXAM: MRI HEAD WITHOUT CONTRAST  TECHNIQUE: Multiplanar, multiecho pulse sequences of the brain and surrounding structures were obtained without intravenous contrast. COMPARISON:  Head CT 08/18/2019. FINDINGS: Brain: No restricted diffusion to suggest acute infarction. No midline shift, mass effect, evidence of mass lesion, ventriculomegaly, extra-axial collection or acute intracranial hemorrhage. Cervicomedullary junction and pituitary are within normal limits. Cerebral volume is within normal limits for age. Pearline Cables and white matter signal is within normal limits throughout the brain. No encephalomalacia or chronic cerebral blood products. Vascular: Major intracranial vascular flow voids are  preserved. Skull and upper cervical spine: Normal visible cervical spine. Visualized bone marrow signal is within normal limits. Sinuses/Orbits: Negative. Other: Visible internal auditory structures appear normal. Mastoids are clear. Scalp and face soft tissues appear negative. IMPRESSION: Normal noncontrast MRI appearance of the brain. Electronically Signed   By: Genevie Ann M.D.   On: 08/19/2019 11:17   EEG adult  Result Date: 08/19/2019 Phillips Odor, MD     08/19/2019  5:55 PM [place] A. Merlene Laughter, MD     [email or web address]       HISTORY: The patient is a 54 year old presents with episodes of vertiginous symptoms and headaches suspicious for complex partial seizures. MEDICATIONS: Current Facility-Administered Medications: .  acetaminophen (TYLENOL) tablet 650 mg, 650 mg, Oral, Q4H PRN, 650 mg at 08/18/19 1247 **OR** acetaminophen (TYLENOL) 160 MG/5ML solution 650 mg, 650 mg, Per Tube, Q4H PRN **OR** acetaminophen (TYLENOL) suppository 650 mg, 650 mg, Rectal, Q4H PRN, Manuella Ghazi, Pratik D, DO .  aspirin chewable tablet 81 mg, 81 mg, Oral, Daily, Manuella Ghazi, Pratik D, DO, 81 mg at 08/19/19 0904 .  butalbital-acetaminophen-caffeine (FIORICET) 50-325-40 MG per tablet 1 tablet, 1 tablet, Oral, Q4H PRN, Heath Lark D, DO, 1 tablet at 08/19/19 1232 .  enoxaparin (LOVENOX) injection 40 mg, 40 mg, Subcutaneous, Q24H, Manuella Ghazi, Pratik D, DO, 40 mg at 08/19/19 1232 .  labetalol (NORMODYNE) injection 10 mg, 10 mg, Intravenous, Q2H PRN, Manuella Ghazi, Pratik D, DO .  nicotine (NICODERM CQ - dosed in mg/24 hr) patch 7 mg, 7 mg, Transdermal, Daily, Manuella Ghazi, Pratik D, DO, 7 mg at 08/19/19 0909 .  pantoprazole (PROTONIX) EC tablet 40 mg, 40 mg, Oral, Daily, Manuella Ghazi, Pratik D, DO, 40 mg at 08/19/19 0904 .  senna-docusate (Senokot-S) tablet 1 tablet, 1 tablet, Oral, QHS PRN, Manuella Ghazi, Pratik D, DO .  simvastatin (ZOCOR) tablet 40 mg, 40 mg, Oral, Daily, Manuella Ghazi, Pratik D, DO, 40 mg at 08/18/19 2151 ANALYSIS: A 16 channel recording using standard  10 20 measurements is conducted for 23 minutes.  There is a well-formed posterior dominant rhythm of 9.5 to 10 Hz which attenuates with eye opening.  There is beta activity observed in frontal areas.  The recording transitions to episodic theta slowing suggestive of drowsiness.  Photic stimulation is carried out without abnormal changes in the background activity.  There is a single area of sharp wave activity that phase reverses P8.  Focal or lateral slowing are not observed. IMPRESSION: 1.  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 Kofi A. Merlene Laughter, M.D. Diplomate, Tax adviser of Psychiatry and Neurology ( Neurology).   ECHOCARDIOGRAM COMPLETE  Result Date: 08/18/2019    ECHOCARDIOGRAM REPORT   Patient Name:   Derek Lang Date of Exam: 08/18/2019 Medical Rec #:  [id number]     Height:       72.0 in Accession #:    [id number]    Weight:       227.0 lb Date of  Birth:  03-Jul-1965      BSA:          2.248 m Patient Age:    54 years      BP:           131/92 mmHg Patient Gender: M             HR:           54 bpm. [facility] Procedure: 2D Echo Indications:    Stroke 434.91 / I163.9  History:        Patient has no prior history of Echocardiogram examinations.                 Risk Factors:Hypertension and Dyslipidemia. Occasional alcohol                 use, headache that began 2 days ago on Saturday.  Sonographer:    Jeryl Columbia RDCS (AE) Referring Phys: [phone] Lamont Dowdy [facility] IMPRESSIONS  1. Left ventricular ejection fraction, by estimation, is 70 to 75%. The left ventricle has hyperdynamic function. The left ventricle has no regional wall motion abnormalities. Left ventricular diastolic parameters were normal.  2. Right ventricular systolic function is normal. The right ventricular size is normal.  3. The mitral valve is normal in structure. Trivial mitral valve regurgitation.  4. The aortic valve is normal in  structure. Aortic valve regurgitation is not visualized.  5. The inferior vena cava is normal in size with greater than 50% respiratory variability, suggesting right atrial pressure of 3 mmHg. FINDINGS  Left Ventricle: Left ventricular ejection fraction, by estimation, is 70 to 75%. The left ventricle has hyperdynamic function. The left ventricle has no regional wall motion abnormalities. The left ventricular internal cavity size was normal in size. There is no left ventricular hypertrophy. Left ventricular diastolic parameters were normal. Right Ventricle: The right ventricular size is normal. Right vetricular wall thickness was not assessed. Right ventricular systolic function is normal. Left Atrium: Left atrial size was normal in size. Right Atrium: Right atrial size was normal in size. Pericardium: There is no evidence of pericardial effusion. Mitral Valve: The mitral valve is normal in structure. Trivial mitral valve regurgitation. Tricuspid Valve: The tricuspid valve is normal in structure. Tricuspid valve regurgitation is trivial. Aortic Valve: The aortic valve is normal in structure. Aortic valve regurgitation is not visualized. Pulmonic Valve: The pulmonic valve was not well visualized. Pulmonic valve regurgitation is not visualized. Aorta: The aortic root is normal in size and structure. Venous: The inferior vena cava is normal in size with greater than 50% respiratory variability, suggesting right atrial pressure of 3 mmHg. IAS/Shunts: No atrial level shunt detected by color flow Doppler.  LEFT VENTRICLE PLAX 2D LVIDd:         4.29 cm  Diastology LVIDs:         2.12 cm  LV e' lateral:   12.70 cm/s LV PW:         1.10 cm  LV E/e' lateral: 5.4 LV IVS:        1.21 cm  LV e' medial:    7.40 cm/s LVOT diam:     2.10 cm  LV E/e' medial:  9.2 LVOT Area:     3.46 cm  RIGHT VENTRICLE RV S prime:     11.30 cm/s TAPSE (M-mode): 2.6 cm LEFT ATRIUM             Index       RIGHT ATRIUM  Index LA diam:         3.80 cm 1.69 cm/m  RA Area:     13.60 cm LA Vol (A2C):   45.5 ml 20.24 ml/m RA Volume:   35.00 ml  15.57 ml/m LA Vol (A4C):   56.5 ml 25.13 ml/m LA Biplane Vol: 53.5 ml 23.80 ml/m   AORTA Ao Root diam: 3.00 cm MITRAL VALVE MV Area (PHT): 3.21 cm    SHUNTS MV Decel Time: 236 msec    Systemic Diam: 2.10 cm MV E velocity: 68.40 cm/s MV A velocity: 45.60 cm/s MV E/A ratio:  1.50 Dietrich PatesPaula Ross MD Electronically signed by Dietrich PatesPaula Ross MD Signature Date/Time: 08/18/2019/5:15:40 PM    Final     Scheduled Meds: . aspirin  81 mg Oral Daily  . enoxaparin (LOVENOX) injection  40 mg Subcutaneous Q24H  . nicotine  7 mg Transdermal Daily  . pantoprazole  40 mg Oral Daily  . simvastatin  40 mg Oral Daily  . topiramate  25 mg Oral Daily   Continuous Infusions:   LOS: 1 day    Time spent: 30 minutes    Vassie Lollarlos Mirtha Jain, MD [facility]   To contact the attending provider between 7A-7P or the covering provider during after hours 7P-7A, please log into the web site [email or web address] and access using universal [place] password for that web site. If you do not have the password, please call the hospital operator.  08/19/2019, 6:09 PM

## 2019-08-19 NOTE — Evaluation (Addendum)
Physical Therapy Evaluation Patient Details Name: Derek Lang MRN: [id number] DOB: 08-01-1965 Today's Date: 08/19/2019   History of Present Illness  Derek Lang is a 54 y.o. male with medical history significant for hypertension, dyslipidemia, occasional alcohol use, and ongoing tobacco abuse who presented today with complaints of headache that began 2 days ago on Saturday.  He states that the headache was quite severe and started on the frontal side of his head and radiated towards the back.  He was also noted to have some speech slurring at that time which then continued to worsen into Sunday and was also accompanied with double vision as well as some dizziness and gait difficulties.  He states that he has some worsening dizziness when he lays down and tends to improve with standing up.  His symptoms worsened today which prompted the ED visit.  He denies any weakness or numbness in his arms or legs.  He rates the pain level currently 3/10.  He has tried some over-the-counter ibuprofen and Tylenol with minimal assistance with his pain.  He denies any fever, chills, nausea, vomiting, chest pain, shortness of breath, or abdominal pain. MRI pending negative  Clinical Impression  Pt admitted with above diagnosis. Pt with abnormal gait, demonstrating good bil dorsiflexion, but ataxic like step progression with difficulty most noted and hips and knees, uneven steps, but no drifting or staggering. Pt with increased unsteadiness with headturns and speed changes when ambulating requiring increased reliance on RW for steadying. Pt with 1 loss of balance but able to recover independently with straight-line gait in hallway. Pt's strength symmetrical and strong throughout BLE, heel to shin test normal bilaterally, and denies abnormal sensation throughout BLE. Pt with difficulty articulating and word finding difficulty requiring increased time and often slurring speech; no difficulty following commands or  understanding questions from therapist. Pt became tearful at EOS due to current functional status; therapist offered support and encouragement. Pt currently with functional limitations due to the deficits listed below (see PT Problem List). Pt will benefit from skilled PT to increase their independence and safety with mobility to allow discharge to the venue listed below.       Follow Up Recommendations Outpatient PT;Home health PT;Supervision - Intermittent (pending improvement)    Equipment Recommendations  Rolling walker with 5" wheels;Cane (pending improvement)    Recommendations for Other Services       Precautions / Restrictions Precautions Precautions: Fall Restrictions Weight Bearing Restrictions: No      Mobility  Bed Mobility Overal bed mobility: Independent  General bed mobility comments: sitting EOB upon arrival  Transfers Overall transfer level: Needs assistance Equipment used: None Transfers: Sit to/from Stand Sit to Stand: Supervision  General transfer comment: slight posterior weight upon rising, improves with cues and providing RW for steadying  Ambulation/Gait Ambulation/Gait assistance: Supervision Gait Distance (Feet): 150 Feet Assistive device: Rolling walker (2 wheeled) Gait Pattern/deviations: Step-through pattern;Decreased stride length;Narrow base of support Gait velocity: decreased   General Gait Details: attempted steps without AD, but very unsteady and fearful of falling; with RW ataxic-like step progression from knees/hips, but good and equal bil dorsiflexion and heel contact, mild difficulty controlling BLE in space and unequal step lengths differring each step, no drifting or staggering noted, 1 loss of balance but able to recover independently; increased unsteadiness with headturns and speed changes requiring increased dependence on RW, but good steadiness with 180 degree turns  Careers information officer  Modified Rankin  (Stroke Patients Only)       Balance Overall balance assessment: Needs assistance Sitting-balance support: Feet supported;No upper extremity supported Sitting balance-Leahy Scale: Good Sitting balance - Comments: seated EOB   Standing balance support: During functional activity;Bilateral upper extremity supported Standing balance-Leahy Scale: Poor Standing balance comment: fair/poor with RW due to increased reliance with head turns and speed changes          Pertinent Vitals/Pain Pain Assessment: 0-10 Pain Score: 3  Pain Location: base of skull/neck Pain Descriptors / Indicators: Headache Pain Intervention(s): Limited activity within patient's tolerance;Monitored during session    Home Living Family/patient expects to be discharged to:: Private residence Living Arrangements: Spouse/significant other Available Help at Discharge: Family;Available PRN/intermittently Type of Home: House Home Access: Stairs to enter Entrance Stairs-Rails: Right;Left;Can reach both Entrance Stairs-Number of Steps: 3 Home Layout: Two level (bed/bath on 2nd level) Home Equipment: Crutches      Prior Function Level of Independence: Independent         Comments: independent in ADLs, mobility, 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 [employer]   Dominant Hand: Left    Extremity/Trunk Assessment   Upper Extremity Assessment Upper Extremity Assessment: Defer to OT evaluation    Lower Extremity Assessment Lower Extremity Assessment: Overall WFL for tasks assessed (Symmetrical, AROM WNL, strength 5/5, denies numbness/tingling, heel to shin symmetrical and normal bilaterally)    Cervical / Trunk Assessment Cervical / Trunk Assessment: Normal  Communication   Communication: Expressive difficulties (slurred speech)  Cognition Arousal/Alertness: Awake/alert Behavior During Therapy: WFL for tasks assessed/performed Overall Cognitive Status: Within Functional Limits  for tasks assessed               General Comments      Exercises     Assessment/Plan    PT Assessment Patient needs continued PT services  PT Problem List Decreased balance       PT Treatment Interventions DME instruction;Gait training;Stair training;Functional mobility training;Therapeutic activities;Therapeutic exercise;Balance training;Neuromuscular re-education;Patient/family education    PT Goals (Current goals can be found in the Care Plan section)  Acute Rehab PT Goals Patient Stated Goal: return home PT Goal Formulation: With patient Time For Goal Achievement: 08/26/19 Potential to Achieve Goals: Good    Frequency Min 3X/week   Barriers to discharge        Co-evaluation               AM-PAC PT "6 Clicks" Mobility  Outcome Measure Help needed turning from your back to your side while in a flat bed without using bedrails?: None Help needed moving from lying on your back to sitting on the side of a flat bed without using bedrails?: None Help needed moving to and from a bed to a chair (including a wheelchair)?: A Little Help needed standing up from a chair using your arms (e.g., wheelchair or bedside chair)?: A Little Help needed to walk in hospital room?: A Little Help needed climbing 3-5 steps with a railing? : A Little 6 Click Score: 20    End of Session Equipment Utilized During Treatment: Gait belt Activity Tolerance: Patient tolerated treatment well Patient left: in bed;with call bell/phone within reach Nurse Communication: Mobility status PT Visit Diagnosis: Difficulty in walking, not elsewhere classified (R26.2);Other symptoms and signs involving the nervous system (R29.898)    Time: 6578-4696 PT Time Calculation (min) (ACUTE ONLY): 18 min   Charges:   PT Evaluation $PT Eval Low Complexity: 1 Low  Talbot Grumbling PT, DPT 08/19/19, 11:51 AM [phone]

## 2019-08-19 NOTE — Procedures (Signed)
°  [facility] Cassell Voorhies A. Gerilyn Pilgrim, MD     [email or web address]           HISTORY: The patient is a 54 year old presents with episodes of vertiginous symptoms and headaches suspicious for complex partial seizures.  MEDICATIONS:  Current Facility-Administered Medications:    acetaminophen (TYLENOL) tablet 650 mg, 650 mg, Oral, Q4H PRN, 650 mg at 08/18/19 1247 **OR** acetaminophen (TYLENOL) 160 MG/5ML solution 650 mg, 650 mg, Per Tube, Q4H PRN **OR** acetaminophen (TYLENOL) suppository 650 mg, 650 mg, Rectal, Q4H PRN, Sherryll Burger, Pratik D, DO   aspirin chewable tablet 81 mg, 81 mg, Oral, Daily, Maurilio Lovely D, DO, 81 mg at 08/19/19 6948   butalbital-acetaminophen-caffeine (FIORICET) 50-325-40 MG per tablet 1 tablet, 1 tablet, Oral, Q4H PRN, Sherryll Burger, Pratik D, DO, 1 tablet at 08/19/19 1232   enoxaparin (LOVENOX) injection 40 mg, 40 mg, Subcutaneous, Q24H, Shah, Pratik D, DO, 40 mg at 08/19/19 1232   labetalol (NORMODYNE) injection 10 mg, 10 mg, Intravenous, Q2H PRN, Sherryll Burger, Pratik D, DO   nicotine (NICODERM CQ - dosed in mg/24 hr) patch 7 mg, 7 mg, Transdermal, Daily, Sherryll Burger, Pratik D, DO, 7 mg at 08/19/19 0909   pantoprazole (PROTONIX) EC tablet 40 mg, 40 mg, Oral, Daily, Sherryll Burger, Pratik D, DO, 40 mg at 08/19/19 5462   senna-docusate (Senokot-S) tablet 1 tablet, 1 tablet, Oral, QHS PRN, Sherryll Burger, Pratik D, DO   simvastatin (ZOCOR) tablet 40 mg, 40 mg, Oral, Daily, Sherryll Burger, Pratik D, DO, 40 mg at 08/18/19 2151     ANALYSIS: A 16 channel recording using standard 10 20 measurements is conducted for 23 minutes.  There is a well-formed posterior dominant rhythm of 9.5 to 10 Hz which attenuates with eye opening.  There is beta activity observed in frontal areas.  The recording transitions to episodic theta slowing suggestive of drowsiness.  Photic stimulation is carried out without abnormal changes in the background activity.  There is a single area of sharp wave activity that phase reverses P8.  Focal  or lateral slowing are not observed.   IMPRESSION: 1.  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      Brittney Caraway A. Gerilyn Pilgrim, M.D.  Diplomate, Biomedical engineer of Psychiatry and Neurology ( Neurology).

## 2019-08-20 MED ORDER — TOPIRAMATE 25 MG PO TABS: 25.0000 mg | ORAL_TABLET | Freq: Two times a day (BID) | ORAL | 3 refills | Status: AC

## 2019-08-20 MED ORDER — VITAMIN B-12 1000 MCG PO TABS: 1000.0000 ug | ORAL_TABLET | Freq: Every day | ORAL | 0 refills | Status: AC

## 2019-08-20 MED ORDER — TOPIRAMATE 25 MG PO TABS: 25.0000 mg | ORAL_TABLET | Freq: Every day | ORAL | 0 refills | Status: AC

## 2019-08-20 MED ORDER — BUTALBITAL-APAP-CAFFEINE 50-325-40 MG PO TABS: 1.0000 | ORAL_TABLET | ORAL | 0 refills | Status: AC | PRN

## 2019-08-20 NOTE — Discharge Summary (Signed)
Physician Discharge Summary  Derek Lang DXI:[id number] DOB: 04-22-1965 DOA: 08/18/2019  PCP: Marya Landry, NP  Admit date: 08/18/2019  Discharge date: 08/20/2019  Admitted From:Home  Disposition:  Home  Recommendations for Outpatient Follow-up:  1. Follow up with PCP in 1-2 weeks 2. Follow-up with Dr. Merlene Laughter as recommended in 4 weeks 3. Remain on vitamin B12 supplementation and follow-up levels 4. Remain on Topamax as prescribed 5. Fioricet prescribed as needed for severe headaches. 6. Continue outpatient PT as recommended  Home Health: None  Equipment/Devices: None  Discharge Condition: Stable  CODE STATUS: Full  Diet recommendation: Heart Healthy  Brief/Interim Summary: As per H&P written by Dr. Manuella Ghazi on 08/18/2019 54 y.o.malewith medical history significant forhypertension, dyslipidemia, occasional alcohol use,and ongoing tobacco abuse who presented today with complaints of headache that began 2 days ago on Saturday. He states that the headache was quite severe and started on the frontal side of his head and radiated towards the back. He was also noted to have some speechslurring at that time which then continued to worsen into Sunday and was also accompanied with double vision as well as some dizziness and gait difficulties. He states that he has some worsening dizziness when he lays down and tends to improve with standing up. His symptoms worsened today which prompted the ED visit. He denies any weakness or numbness in his arms or legs. He rates the pain level currently 3/10. He has tried some over-the-counter ibuprofen and Tylenol with minimal assistance with his pain. He denies any fever, chills, nausea, vomiting, chest pain, shortness of breath, or abdominal pain.  ED Course:Stable vital signs noted and laboratory data without any acute abnormalities. CT of the head with no acute findings noted either. EKG was sinus rhythm at 60 bpm. He is being given  Toradol for his headache. Teleneurology evaluation performed with recommendations for MRI head and MRA head and neck without contrast as well as 2D echocardiogram and neurology evaluation. Multiple lab recommendations ordered as well and pending.  -Patient was admitted with strokelike symptoms with multifactorial etiology.  There was some concerns for partial complex seizure after abnormal EEG noted with results as seen below.  He was therefore started on Topamax by neurology.  MRI with no concerns of acute stroke.  He was also noted to have B12 deficiency and has received some intramuscular injections for repletion and will be kept on oral B12 with further follow-up recommended by his PCP as well as with neurology.  He has been seen by physical therapy with recommendations to continue outpatient physical therapy at this time.  No other acute events or concerns noted throughout the course of this admission.  He is otherwise stable for discharge today.  Discharge Diagnoses:  Active Problems:   CVA (cerebral vascular accident) [facility])   Discharge Instructions  Discharge Instructions    Diet - low sodium heart healthy   Complete by: As directed    Increase activity slowly   Complete by: As directed      Allergies as of 08/20/2019      Reactions   Iodine    Tape       Medication List    TAKE these medications   butalbital-acetaminophen-caffeine 50-325-40 MG tablet Commonly known as: FIORICET Take 1 tablet by mouth every 4 (four) hours as needed for headache.   Cholecalciferol 125 MCG (5000 UT) capsule Take 1 capsule by mouth daily.   losartan 100 MG tablet Commonly known as: COZAAR Take 1 tablet by mouth daily.  omeprazole 20 MG capsule Commonly known as: PRILOSEC Take 20 mg by mouth daily.   simvastatin 40 MG tablet Commonly known as: ZOCOR Take 1 tablet by mouth daily.   tadalafil 20 MG tablet Commonly known as: CIALIS Take 10 mg by mouth daily as needed.   topiramate  25 MG tablet Commonly known as: TOPAMAX Take 1 tablet (25 mg total) by mouth daily for 7 days. Start taking on: August 21, 2019   topiramate 25 MG tablet Commonly known as: TOPAMAX Take 1 tablet (25 mg total) by mouth 2 (two) times daily. Start taking on: August 26, 2019   vitamin B-12 1000 MCG tablet Commonly known as: CYANOCOBALAMIN Take 1 tablet (1,000 mcg total) by mouth daily.       Follow-up Information    Wingate, Katie, NP Follow up in 1 week(s).   Specialty: Nurse Practitioner Contact information: [address] [place] [address] [phone]        Beryle Beams, MD Follow up in 4 week(s).   Specialty: Neurology Contact information: [address] [place] [address] [phone]              Allergies  Allergen Reactions  . Iodine   . Tape     Consultations:  Neurology   Procedures/Studies: CT HEAD WO CONTRAST  Result Date: 08/18/2019 CLINICAL DATA:  Headache, normal neuro exam EXAM: CT HEAD WITHOUT CONTRAST TECHNIQUE: Contiguous axial images were obtained from the base of the skull through the vertex without intravenous contrast. COMPARISON:  None FINDINGS: Brain: No evidence of acute infarction, hemorrhage, hydrocephalus, extra-axial collection or mass lesion/mass effect. Vascular: No hyperdense vessel or unexpected calcification. Skull: Normal. Negative for fracture or focal lesion. Sinuses/Orbits: No acute finding. Other: None. IMPRESSION: No acute intracranial pathology. Electronically Signed   By: Donzetta Kohut M.D.   On: 08/18/2019 08:09   MR ANGIO HEAD WO CONTRAST  Result Date: 08/19/2019 CLINICAL DATA:  54 year old male with persistent headache for 3 days. Subacute neurologic deficit(s). EXAM: MRA HEAD WITHOUT CONTRAST TECHNIQUE: Angiographic images of the Circle of Willis were obtained using MRA technique without intravenous contrast. COMPARISON:  Brain MRI and neck MRA today reported separately. FINDINGS: Antegrade flow in  the posterior circulation with fairly codominant distal vertebral arteries. Normal PICA origins and vertebrobasilar junction (the vertebrobasilar junction is slightly elongated, normal variant). Normal AICA origins. Patent basilar artery without stenosis. Normal SCA and PCA origins. Posterior communicating arteries are diminutive or absent. Bilateral PCA branches are within normal limits. Antegrade flow in both ICA siphons. No siphon stenosis. Normal ophthalmic artery origins. Patent carotid termini. Normal MCA and ACA origins. Anterior communicating artery and visible ACA branches are within normal limits. Bilateral MCA M1 segments and MCA bi/trifurcations are patent without stenosis. Visible bilateral MCA branches are within normal limits. IMPRESSION: Normal intracranial MRA. Electronically Signed   By: Odessa Fleming M.D.   On: 08/19/2019 11:25   MR ANGIO NECK W WO CONTRAST  Result Date: 08/19/2019 CLINICAL DATA:  54 year old male with persistent headache for 3 days. Subacute neurologic deficit(s). EXAM: MRA NECK WITHOUT AND WITH CONTRAST TECHNIQUE: Multiplanar and multiecho pulse sequences of the neck were obtained without and with intravenous contrast. Angiographic images of the neck were obtained using MRA technique without and with intravenous contrast. CONTRAST:  65mL GADAVIST GADOBUTROL 1 MMOL/ML IV SOLN COMPARISON:  Head CT and brain MRI today. FINDINGS: Precontrast time-of-flight images demonstrate antegrade flow in both cervical carotid and vertebral arteries. Vertebral arteries appear codominant and tortuous. Carotid bifurcations  are within normal limits. Post-contrast neck MRA images demonstrate a 3 vessel arch configuration. The right CCA, right carotid bifurcation and cervical right ICA appear normal. The left CCA, left carotid bifurcation and cervical left ICA appear normal. Proximal subclavian arteries and vertebral artery origins appear within normal limits. Codominant vertebral arteries with mild V2  tortuosity (greater on the left) appear normal. Patent visible anterior and posterior circulation. See also intracranial MRA reported separately. IMPRESSION: Normal Neck MRA. Electronically Signed   By: Odessa Fleming M.D.   On: 08/19/2019 11:20   MR BRAIN WO CONTRAST  Result Date: 08/19/2019 CLINICAL DATA:  53 year old male with persistent headache for 3 days. Subacute neurologic deficit(s). EXAM: MRI HEAD WITHOUT CONTRAST TECHNIQUE: Multiplanar, multiecho pulse sequences of the brain and surrounding structures were obtained without intravenous contrast. COMPARISON:  Head CT 08/18/2019. FINDINGS: Brain: No restricted diffusion to suggest acute infarction. No midline shift, mass effect, evidence of mass lesion, ventriculomegaly, extra-axial collection or acute intracranial hemorrhage. Cervicomedullary junction and pituitary are within normal limits. Cerebral volume is within normal limits for age. Wallace Cullens and white matter signal is within normal limits throughout the brain. No encephalomalacia or chronic cerebral blood products. Vascular: Major intracranial vascular flow voids are preserved. Skull and upper cervical spine: Normal visible cervical spine. Visualized bone marrow signal is within normal limits. Sinuses/Orbits: Negative. Other: Visible internal auditory structures appear normal. Mastoids are clear. Scalp and face soft tissues appear negative. IMPRESSION: Normal noncontrast MRI appearance of the brain. Electronically Signed   By: Odessa Fleming M.D.   On: 08/19/2019 11:17   EEG adult  Result Date: 08/19/2019 Beryle Beams, MD     08/19/2019  5:55 PM [facility] Kofi A. Gerilyn Pilgrim, MD     [email or web address]       HISTORY: The patient is a 54 year old presents with episodes of vertiginous symptoms and headaches suspicious for complex partial seizures. MEDICATIONS: Current Facility-Administered Medications: .  acetaminophen (TYLENOL) tablet 650 mg, 650 mg, Oral, Q4H PRN, 650 mg at 08/18/19 1247 **OR**  acetaminophen (TYLENOL) 160 MG/5ML solution 650 mg, 650 mg, Per Tube, Q4H PRN **OR** acetaminophen (TYLENOL) suppository 650 mg, 650 mg, Rectal, Q4H PRN, Sherryll Burger, Yitzchak Kothari D, DO .  aspirin chewable tablet 81 mg, 81 mg, Oral, Daily, Sherryll Burger, Tayia Stonesifer D, DO, 81 mg at 08/19/19 0904 .  butalbital-acetaminophen-caffeine (FIORICET) 50-325-40 MG per tablet 1 tablet, 1 tablet, Oral, Q4H PRN, Maurilio Lovely D, DO, 1 tablet at 08/19/19 1232 .  enoxaparin (LOVENOX) injection 40 mg, 40 mg, Subcutaneous, Q24H, Sherryll Burger, Camielle Sizer D, DO, 40 mg at 08/19/19 1232 .  labetalol (NORMODYNE) injection 10 mg, 10 mg, Intravenous, Q2H PRN, Sherryll Burger, Nagee Goates D, DO .  nicotine (NICODERM CQ - dosed in mg/24 hr) patch 7 mg, 7 mg, Transdermal, Daily, Sherryll Burger, Atarah Cadogan D, DO, 7 mg at 08/19/19 0909 .  pantoprazole (PROTONIX) EC tablet 40 mg, 40 mg, Oral, Daily, Sherryll Burger, Zafir Schauer D, DO, 40 mg at 08/19/19 0904 .  senna-docusate (Senokot-S) tablet 1 tablet, 1 tablet, Oral, QHS PRN, Sherryll Burger, Eber Ferrufino D, DO .  simvastatin (ZOCOR) tablet 40 mg, 40 mg, Oral, Daily, Sherryll Burger, Alastor Kneale D, DO, 40 mg at 08/18/19 2151 ANALYSIS: A 16 channel recording using standard 10 20 measurements is conducted for 23 minutes.  There is a well-formed posterior dominant rhythm of 9.5 to 10 Hz which attenuates with eye opening.  There is beta activity observed in frontal areas.  The recording transitions to episodic theta slowing suggestive of drowsiness.  Photic stimulation is carried out without abnormal changes  in the background activity.  There is a single area of sharp wave activity that phase reverses P8.  Focal or lateral slowing are not observed. IMPRESSION: 1.  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 Kofi A. Gerilyn Pilgrim, M.D. Diplomate, Biomedical engineer of Psychiatry and Neurology ( Neurology).   ECHOCARDIOGRAM COMPLETE  Result Date: 08/18/2019    ECHOCARDIOGRAM REPORT   Patient Name:   Derek Lang Date of Exam: 08/18/2019 Medical  Rec #:  [id number]     Height:       72.0 in Accession #:    [id number]    Weight:       227.0 lb Date of Birth:  02-20-1966      BSA:          2.248 m Patient Age:    54 years      BP:           131/92 mmHg Patient Gender: M             HR:           54 bpm. [facility] Procedure: 2D Echo Indications:    Stroke 434.91 / I163.9  History:        Patient has no prior history of Echocardiogram examinations.                 Risk Factors:Hypertension and Dyslipidemia. Occasional alcohol                 use, headache that began 2 days ago on Saturday.  Sonographer:    Jeryl Columbia RDCS (AE) Referring Phys: [phone] Lamont Dowdy [facility] IMPRESSIONS  1. Left ventricular ejection fraction, by estimation, is 70 to 75%. The left ventricle has hyperdynamic function. The left ventricle has no regional wall motion abnormalities. Left ventricular diastolic parameters were normal.  2. Right ventricular systolic function is normal. The right ventricular size is normal.  3. The mitral valve is normal in structure. Trivial mitral valve regurgitation.  4. The aortic valve is normal in structure. Aortic valve regurgitation is not visualized.  5. The inferior vena cava is normal in size with greater than 50% respiratory variability, suggesting right atrial pressure of 3 mmHg. FINDINGS  Left Ventricle: Left ventricular ejection fraction, by estimation, is 70 to 75%. The left ventricle has hyperdynamic function. The left ventricle has no regional wall motion abnormalities. The left ventricular internal cavity size was normal in size. There is no left ventricular hypertrophy. Left ventricular diastolic parameters were normal. Right Ventricle: The right ventricular size is normal. Right vetricular wall thickness was not assessed. Right ventricular systolic function is normal. Left Atrium: Left atrial size was normal in size. Right Atrium: Right atrial size was normal in size. Pericardium: There is no evidence of pericardial effusion.  Mitral Valve: The mitral valve is normal in structure. Trivial mitral valve regurgitation. Tricuspid Valve: The tricuspid valve is normal in structure. Tricuspid valve regurgitation is trivial. Aortic Valve: The aortic valve is normal in structure. Aortic valve regurgitation is not visualized. Pulmonic Valve: The pulmonic valve was not well visualized. Pulmonic valve regurgitation is not visualized. Aorta: The aortic root is normal in size and structure. Venous: The inferior vena cava is normal in size with greater than 50% respiratory variability, suggesting right atrial pressure of 3 mmHg. IAS/Shunts: No atrial level shunt detected by color flow Doppler.  LEFT VENTRICLE PLAX 2D LVIDd:  4.29 cm  Diastology LVIDs:         2.12 cm  LV e' lateral:   12.70 cm/s LV PW:         1.10 cm  LV E/e' lateral: 5.4 LV IVS:        1.21 cm  LV e' medial:    7.40 cm/s LVOT diam:     2.10 cm  LV E/e' medial:  9.2 LVOT Area:     3.46 cm  RIGHT VENTRICLE RV S prime:     11.30 cm/s TAPSE (M-mode): 2.6 cm LEFT ATRIUM             Index       RIGHT ATRIUM           Index LA diam:        3.80 cm 1.69 cm/m  RA Area:     13.60 cm LA Vol (A2C):   45.5 ml 20.24 ml/m RA Volume:   35.00 ml  15.57 ml/m LA Vol (A4C):   56.5 ml 25.13 ml/m LA Biplane Vol: 53.5 ml 23.80 ml/m   AORTA Ao Root diam: 3.00 cm MITRAL VALVE MV Area (PHT): 3.21 cm    SHUNTS MV Decel Time: 236 msec    Systemic Diam: 2.10 cm MV E velocity: 68.40 cm/s MV A velocity: 45.60 cm/s MV E/A ratio:  1.50 Dietrich Pates MD Electronically signed by Dietrich Pates MD Signature Date/Time: 08/18/2019/5:15:40 PM    Final      Discharge Exam: Vitals:   08/19/19 2144 08/20/19 0513  BP: 129/80 120/76  Pulse: 62 (!) 57  Resp: 16 17  Temp: 98.3 F (36.8 C) 98.6 F (37 C)  SpO2: 96% 97%   Vitals:   08/19/19 1800 08/19/19 2115 08/19/19 2144 08/20/19 0513  BP: 112/61  129/80 120/76  Pulse: 60  62 (!) 57  Resp: 16  16 17   Temp: 98.3 F (36.8 C)  98.3 F (36.8 C) 98.6 F  (37 C)  TempSrc:    Oral  SpO2: 98% 98% 96% 97%  Weight:      Height:        General: Pt is alert, awake, not in acute distress Cardiovascular: RRR, S1/S2 +, no rubs, no gallops Respiratory: CTA bilaterally, no wheezing, no rhonchi Abdominal: Soft, NT, ND, bowel sounds + Extremities: no edema, no cyanosis    The results of significant diagnostics from this hospitalization (including imaging, microbiology, ancillary and laboratory) are listed below for reference.     Microbiology: Recent Results (from the past 240 hour(s))  SARS Coronavirus 2 by RT PCR (hospital order, performed in [facility] hospital lab) Nasopharyngeal Nasopharyngeal Swab     Status: None   Collection Time: 08/18/19 11:18 AM   Specimen: Nasopharyngeal Swab  Result Value Ref Range Status   SARS Coronavirus 2 NEGATIVE NEGATIVE Final    Comment: (NOTE) SARS-CoV-2 target nucleic acids are NOT DETECTED.  The SARS-CoV-2 RNA is generally detectable in upper and lower respiratory specimens during the acute phase of infection. The lowest concentration of SARS-CoV-2 viral copies this assay can detect is 250 copies / mL. A negative result does not preclude SARS-CoV-2 infection and should not be used as the sole basis for treatment or other patient management decisions.  A negative result may occur with improper specimen collection / handling, submission of specimen other than nasopharyngeal swab, presence of viral mutation(s) within the areas targeted by this assay, and inadequate number of viral copies (<250 copies / mL). A negative  result must be combined with clinical observations, patient history, and epidemiological information.  Fact Sheet for Patients:   BoilerBrush.com.cy  Fact Sheet for Healthcare Providers: https://pope.com/  This test is not yet approved or  cleared by the [place] FDA and has been authorized for detection and/or diagnosis of  SARS-CoV-2 by FDA under an Emergency Use Authorization (EUA).  This EUA will remain in effect (meaning this test can be used) for the duration of the COVID-19 declaration under Section 564(b)(1) of the Act, 21 U.S.C. section 360bbb-3(b)(1), unless the authorization is terminated or revoked sooner.  Performed at [facility], [address], [place], [place] [address]      Labs: BNP (last 3 results) No results for input(s): BNP in the last 8760 hours. Basic Metabolic Panel: Recent Labs  Lab 08/18/19 0747  NA 137  K 3.9  CL 105  CO2 23  GLUCOSE 107*  BUN 11  CREATININE 0.66  CALCIUM 8.6*   Liver Function Tests: Recent Labs  Lab 08/18/19 0747  AST 19  ALT 25  ALKPHOS 102  BILITOT 0.4  PROT 6.8  ALBUMIN 3.9   No results for input(s): LIPASE, AMYLASE in the last 168 hours. Recent Labs  Lab 08/18/19 1047  AMMONIA 36*   CBC: Recent Labs  Lab 08/18/19 0747  WBC 6.7  NEUTROABS 4.5  HGB 15.4  HCT 45.6  MCV 90.5  PLT 188   Cardiac Enzymes: No results for input(s): CKTOTAL, CKMB, CKMBINDEX, TROPONINI in the last 168 hours. BNP: Invalid input(s): POCBNP CBG: Recent Labs  Lab 08/18/19 0759 08/18/19 1620  GLUCAP 98 89   D-Dimer No results for input(s): DDIMER in the last 72 hours. Hgb A1c Recent Labs    08/19/19 0547  HGBA1C 5.4   Lipid Profile Recent Labs    08/19/19 0547  CHOL 137  HDL 25*  LDLCALC 75  TRIG 621*  CHOLHDL 5.5   Thyroid function studies Recent Labs    08/18/19 1047  TSH 0.604   Anemia work up Recent Labs    08/18/19 1047  VITAMINB12 153*   Urinalysis    Component Value Date/Time   COLORURINE YELLOW 08/18/2019 1044   APPEARANCEUR CLEAR 08/18/2019 1044   LABSPEC 1.015 08/18/2019 1044   PHURINE 6.0 08/18/2019 1044   GLUCOSEU NEGATIVE 08/18/2019 1044   HGBUR NEGATIVE 08/18/2019 1044   BILIRUBINUR NEGATIVE 08/18/2019 1044   KETONESUR NEGATIVE 08/18/2019 1044   PROTEINUR NEGATIVE 08/18/2019 1044   NITRITE NEGATIVE  08/18/2019 1044   LEUKOCYTESUR NEGATIVE 08/18/2019 1044   Sepsis Labs Invalid input(s): PROCALCITONIN,  WBC,  LACTICIDVEN Microbiology Recent Results (from the past 240 hour(s))  SARS Coronavirus 2 by RT PCR (hospital order, performed in [facility] hospital lab) Nasopharyngeal Nasopharyngeal Swab     Status: None   Collection Time: 08/18/19 11:18 AM   Specimen: Nasopharyngeal Swab  Result Value Ref Range Status   SARS Coronavirus 2 NEGATIVE NEGATIVE Final    Comment: (NOTE) SARS-CoV-2 target nucleic acids are NOT DETECTED.  The SARS-CoV-2 RNA is generally detectable in upper and lower respiratory specimens during the acute phase of infection. The lowest concentration of SARS-CoV-2 viral copies this assay can detect is 250 copies / mL. A negative result does not preclude SARS-CoV-2 infection and should not be used as the sole basis for treatment or other patient management decisions.  A negative result may occur with improper specimen collection / handling, submission of specimen other than nasopharyngeal swab, presence of viral mutation(s) within the areas targeted  by this assay, and inadequate number of viral copies (<250 copies / mL). A negative result must be combined with clinical observations, patient history, and epidemiological information.  Fact Sheet for Patients:   BoilerBrush.com.cyhttps://www.fda.gov/media/136312/download  Fact Sheet for Healthcare Providers: https://pope.com/https://www.fda.gov/media/136313/download  This test is not yet approved or  cleared by the [place] FDA and has been authorized for detection and/or diagnosis of SARS-CoV-2 by FDA under an Emergency Use Authorization (EUA).  This EUA will remain in effect (meaning this test can be used) for the duration of the COVID-19 declaration under Section 564(b)(1) of the Act, 21 U.S.C. section 360bbb-3(b)(1), unless the authorization is terminated or revoked sooner.  Performed at [facility], [address] [place], [place]  [id number]      Time coordinating discharge: 35 minutes  SIGNED:   Erick BlinksPratik D Taniqua Issa, DO [facility] 08/20/2019, 11:19 AM  If 7PM-7AM, please contact night-coverage [email or web address]

## 2019-08-20 NOTE — Plan of Care (Signed)

## 2019-08-20 NOTE — Plan of Care (Signed)

## 2019-08-28 ENCOUNTER — Other Ambulatory Visit: Payer: Self-pay

## 2019-08-28 ENCOUNTER — Encounter: Payer: Self-pay | Admitting: Physical Therapy

## 2019-08-28 ENCOUNTER — Ambulatory Visit: Payer: 59 | Attending: Internal Medicine | Admitting: Physical Therapy

## 2019-08-28 DIAGNOSIS — M6281 Muscle weakness (generalized): Secondary | ICD-10-CM | POA: Diagnosis not present

## 2019-08-28 NOTE — Therapy (Signed)
[facility] [address] [place], [place], [address] Phone: [phone]   Fax:  [phone]  Physical Therapy Evaluation  Patient Details  Name: Derek Lang MRN: [id number] Date of Birth: 04-Jan-1966 Referring Provider (PT): Maurilio Lovely, MD   Encounter Date: 08/28/2019   PT End of Session - 08/28/19 1819    Visit Number 1    Number of Visits 1    Date for PT Re-Evaluation 09/25/19    PT Start Time 1427    PT Stop Time 1503    PT Time Calculation (min) 36 min    Activity Tolerance Patient tolerated treatment well    Behavior During Therapy [facility] for tasks assessed/performed           Past Medical History:  Diagnosis Date  . ED (erectile dysfunction)   . Hypercholesterolemia   . Hypertension     Past Surgical History:  Procedure Laterality Date  . ACHILLES TENDON REPAIR    . APPENDECTOMY    . FOOT SURGERY      There were no vitals filed for this visit.    Subjective Assessment - 08/28/19 1631    Subjective COVID-19 screening performed upon arrival.Patient arrives to physical therapy with weakness and difficulty walking that began on 08/18/2019. Patient was admitted to ER to which patient reports diagnostic testing came back inconclusive for stroke and he may have had a seizure. Patient reports since returning home, has made daily improvements to his normal baseline with exception of returning to work and driving. Patient denies pain and denies any loss of balance. Patient's goals are to return to work and driving.    Diagnostic tests inconclusive for stroke    Patient Stated Goals return to work and    Currently in Pain? No/denies              [facility] PT Assessment - 08/28/19 0001      Assessment   Medical Diagnosis Gait instability    Referring Provider (PT) Maurilio Lovely, MD    Onset Date/Surgical Date 08/18/19    Next MD Visit n/a    Prior Therapy no      Precautions   Precautions None      Restrictions    Weight Bearing Restrictions No      Balance Screen   Has the patient fallen in the past 6 months No    Has the patient had a decrease in activity level because of a fear of falling?  No    Is the patient reluctant to leave their home because of a fear of falling?  No      Home Nurse, mental health Private residence    Living Arrangements Spouse/significant other      Prior Function   Level of Independence Independent    Vocation --   on medical leave at this time     ROM / Strength   AROM / PROM / Strength Strength      Strength   Strength Assessment Site Hip;Knee;Ankle    Right/Left Hip Right;Left    Right Hip Flexion 4+/5    Left Hip Flexion 4+/5    Right/Left Knee Right;Left    Right Knee Flexion 4+/5    Right Knee Extension 5/5    Left Knee Flexion 4+/5    Left Knee Extension 5/5    Right/Left Ankle Left;Right    Right Ankle Dorsiflexion 5/5    Right Ankle Plantar Flexion 5/5    Right Ankle Inversion  5/5    Right Ankle Eversion 5/5    Left Ankle Dorsiflexion 5/5    Left Ankle Plantar Flexion 5/5    Left Ankle Inversion 5/5    Left Ankle Eversion 5/5      Ambulation/Gait   Gait Pattern Step-through pattern;Decreased stance time - left      Standardized Balance Assessment   Standardized Balance Assessment Berg Balance Test      Berg Balance Test   Sit to Stand Able to stand without using hands and stabilize independently    Standing Unsupported Able to stand safely 2 minutes    Sitting with Back Unsupported but Feet Supported on Floor or Stool Able to sit safely and securely 2 minutes    Stand to Sit Sits safely with minimal use of hands    Transfers Able to transfer safely, minor use of hands    Standing Unsupported with Eyes Closed Able to stand 10 seconds safely    Standing Unsupported with Feet Together Able to place feet together independently and stand 1 minute safely    From Standing, Reach Forward with Outstretched Arm Can reach confidently  >25 cm (10")    From Standing Position, Pick up Object from Floor Able to pick up shoe safely and easily    From Standing Position, Turn to Look Behind Over each Shoulder Looks behind from both sides and weight shifts well    Turn 360 Degrees Able to turn 360 degrees safely in 4 seconds or less    Standing Unsupported, Alternately Place Feet on Step/Stool Able to stand independently and safely and complete 8 steps in 20 seconds    Standing Unsupported, One Foot in Front Able to place foot tandem independently and hold 30 seconds    Standing on One Leg Able to lift leg independently and hold > 10 seconds    Total Score 56                      Objective measurements completed on examination: See above findings.               PT Education - 08/28/19 1818    Education Details walking program,sit to stands, single leg sit to stand    Person(s) Educated Patient    Methods Explanation;Handout;Demonstration    Comprehension Verbalized understanding                       Plan - 08/28/19 1821    Clinical Impression Statement Patient is a 54 year old male who presents to physical therapy with normal LE MMT and minimal gait deviations. Patient was able to complete [facility] Scale with no limitations, 56/56. Patient with minimal gait deviations slight decrease in left stance time secondary to Achilles surgery in 03/2019. Patient and PT discussed slow progression into physical activities. Patient and PT reviewed HEP to which patient reported understanding. Patient to perform HEP at this time and slowly return to the [facility]. PT episode of care will be open for 1 month if any problems then discharge.    Stability/Clinical Decision Making Stable/Uncomplicated    Clinical Decision Making Low    Rehab Potential Excellent    PT Frequency One time visit    PT Treatment/Interventions Gait training;Stair training;Functional mobility training;ADLs/Self Care Home  Management;Patient/family education;Therapeutic activities;Therapeutic exercise;Balance training;Neuromuscular re-education;Manual techniques;Passive range of motion    PT Next Visit Plan HEP only; if returns nustep, general strengthening and advanced balance activities.  Consulted and Agree with Plan of Care Patient           Patient will benefit from skilled therapeutic intervention in order to improve the following deficits and impairments:  Decreased activity tolerance  Visit Diagnosis: Muscle weakness (generalized) - Plan: PT plan of care cert/re-cert     Problem List Patient Active Problem List   Diagnosis Date Noted  . CVA (cerebral vascular accident) [facility]) 08/18/2019    Gabriela Eves, PT, DPT 08/28/2019, 6:28 PM  [facility] [place], [place], [address] Phone: [phone]   Fax:  [phone]  Name: Derek Lang MRN: [id number] Date of Birth: 08-Oct-1965
